# Patient Record
Sex: Male | Born: 1945
Health system: Southern US, Community
[De-identification: ages and names within clinical notes are randomized; demographics above are authoritative.]

## PROBLEM LIST (undated history)

## (undated) DIAGNOSIS — J301 Allergic rhinitis due to pollen: Secondary | ICD-10-CM

## (undated) DIAGNOSIS — N529 Male erectile dysfunction, unspecified: Secondary | ICD-10-CM

## (undated) DIAGNOSIS — I10 Essential (primary) hypertension: Secondary | ICD-10-CM

## (undated) DIAGNOSIS — G4730 Sleep apnea, unspecified: Secondary | ICD-10-CM

## (undated) DIAGNOSIS — Z6831 Body mass index (BMI) 31.0-31.9, adult: Secondary | ICD-10-CM

## (undated) DIAGNOSIS — E669 Obesity, unspecified: Secondary | ICD-10-CM

## (undated) DIAGNOSIS — E7800 Pure hypercholesterolemia, unspecified: Secondary | ICD-10-CM

## (undated) DIAGNOSIS — R911 Solitary pulmonary nodule: Secondary | ICD-10-CM

## (undated) DIAGNOSIS — K219 Gastro-esophageal reflux disease without esophagitis: Secondary | ICD-10-CM

## (undated) DIAGNOSIS — I700 Atherosclerosis of aorta: Secondary | ICD-10-CM

## (undated) DIAGNOSIS — Q211 Atrial septal defect, unspecified: Secondary | ICD-10-CM

## (undated) DIAGNOSIS — K22710 Barrett's esophagus with low grade dysplasia: Secondary | ICD-10-CM

## (undated) HISTORY — DX: Pure hypercholesterolemia, unspecified: E78.00

## (undated) HISTORY — DX: Body mass index (BMI) 31.0-31.9, adult: Z68.31

## (undated) HISTORY — DX: Barrett's esophagus with low grade dysplasia: K22.710

## (undated) HISTORY — DX: Male erectile dysfunction, unspecified: N52.9

## (undated) HISTORY — DX: Allergic rhinitis due to pollen: J30.1

## (undated) HISTORY — DX: Atrial septal defect, unspecified: Q21.10

## (undated) HISTORY — DX: Obesity, unspecified: E66.9

## (undated) HISTORY — DX: Solitary pulmonary nodule: R91.1

## (undated) HISTORY — DX: Atherosclerosis of aorta: I70.0

---

## 1997-12-31 ENCOUNTER — Ambulatory Visit (HOSPITAL_COMMUNITY): Admission: RE | Admit: 1997-12-31 | Discharge: 1997-12-31 | Payer: Self-pay | Admitting: Gastroenterology

## 2001-01-17 ENCOUNTER — Ambulatory Visit (HOSPITAL_COMMUNITY): Admission: RE | Admit: 2001-01-17 | Discharge: 2001-01-17 | Payer: Self-pay | Admitting: Gastroenterology

## 2003-12-29 ENCOUNTER — Ambulatory Visit (HOSPITAL_COMMUNITY): Admission: RE | Admit: 2003-12-29 | Discharge: 2003-12-29 | Payer: Self-pay | Admitting: Gastroenterology

## 2012-12-18 DIAGNOSIS — Z23 Encounter for immunization: Secondary | ICD-10-CM | POA: Diagnosis not present

## 2012-12-18 DIAGNOSIS — Z1331 Encounter for screening for depression: Secondary | ICD-10-CM | POA: Diagnosis not present

## 2012-12-18 DIAGNOSIS — R011 Cardiac murmur, unspecified: Secondary | ICD-10-CM | POA: Diagnosis not present

## 2012-12-18 DIAGNOSIS — G4733 Obstructive sleep apnea (adult) (pediatric): Secondary | ICD-10-CM | POA: Diagnosis not present

## 2012-12-18 DIAGNOSIS — Z0000 Encounter for general adult medical examination without abnormal findings: Secondary | ICD-10-CM | POA: Diagnosis not present

## 2012-12-18 DIAGNOSIS — I10 Essential (primary) hypertension: Secondary | ICD-10-CM | POA: Diagnosis not present

## 2012-12-18 DIAGNOSIS — K219 Gastro-esophageal reflux disease without esophagitis: Secondary | ICD-10-CM | POA: Diagnosis not present

## 2012-12-18 DIAGNOSIS — R972 Elevated prostate specific antigen [PSA]: Secondary | ICD-10-CM | POA: Diagnosis not present

## 2012-12-18 DIAGNOSIS — E785 Hyperlipidemia, unspecified: Secondary | ICD-10-CM | POA: Diagnosis not present

## 2013-01-06 ENCOUNTER — Encounter: Payer: Self-pay | Admitting: Cardiovascular Disease

## 2013-01-06 ENCOUNTER — Ambulatory Visit (HOSPITAL_COMMUNITY): Payer: Medicare Other | Attending: Internal Medicine | Admitting: Cardiology

## 2013-01-06 ENCOUNTER — Other Ambulatory Visit (HOSPITAL_COMMUNITY): Payer: Self-pay | Admitting: Internal Medicine

## 2013-01-06 DIAGNOSIS — R011 Cardiac murmur, unspecified: Secondary | ICD-10-CM | POA: Insufficient documentation

## 2013-01-06 DIAGNOSIS — Q2111 Secundum atrial septal defect: Secondary | ICD-10-CM | POA: Insufficient documentation

## 2013-01-06 DIAGNOSIS — Q211 Atrial septal defect: Secondary | ICD-10-CM | POA: Insufficient documentation

## 2013-01-06 DIAGNOSIS — E785 Hyperlipidemia, unspecified: Secondary | ICD-10-CM | POA: Diagnosis not present

## 2013-01-06 DIAGNOSIS — I10 Essential (primary) hypertension: Secondary | ICD-10-CM | POA: Diagnosis not present

## 2013-01-06 DIAGNOSIS — I079 Rheumatic tricuspid valve disease, unspecified: Secondary | ICD-10-CM | POA: Insufficient documentation

## 2013-01-06 NOTE — Progress Notes (Signed)
Echo performed. 

## 2013-02-12 DIAGNOSIS — K219 Gastro-esophageal reflux disease without esophagitis: Secondary | ICD-10-CM | POA: Diagnosis not present

## 2013-02-12 DIAGNOSIS — E785 Hyperlipidemia, unspecified: Secondary | ICD-10-CM | POA: Diagnosis not present

## 2013-02-12 DIAGNOSIS — R011 Cardiac murmur, unspecified: Secondary | ICD-10-CM | POA: Diagnosis not present

## 2013-02-12 DIAGNOSIS — I10 Essential (primary) hypertension: Secondary | ICD-10-CM | POA: Diagnosis not present

## 2013-02-12 DIAGNOSIS — R972 Elevated prostate specific antigen [PSA]: Secondary | ICD-10-CM | POA: Diagnosis not present

## 2013-03-11 DIAGNOSIS — R972 Elevated prostate specific antigen [PSA]: Secondary | ICD-10-CM | POA: Diagnosis not present

## 2013-03-11 DIAGNOSIS — E785 Hyperlipidemia, unspecified: Secondary | ICD-10-CM | POA: Diagnosis not present

## 2013-03-11 DIAGNOSIS — Z79899 Other long term (current) drug therapy: Secondary | ICD-10-CM | POA: Diagnosis not present

## 2013-03-11 DIAGNOSIS — Q2111 Secundum atrial septal defect: Secondary | ICD-10-CM | POA: Diagnosis not present

## 2013-03-11 DIAGNOSIS — K219 Gastro-esophageal reflux disease without esophagitis: Secondary | ICD-10-CM | POA: Diagnosis not present

## 2013-03-11 DIAGNOSIS — I10 Essential (primary) hypertension: Secondary | ICD-10-CM | POA: Diagnosis not present

## 2013-03-11 DIAGNOSIS — Q211 Atrial septal defect: Secondary | ICD-10-CM | POA: Diagnosis not present

## 2013-06-18 DIAGNOSIS — E785 Hyperlipidemia, unspecified: Secondary | ICD-10-CM | POA: Diagnosis not present

## 2013-06-18 DIAGNOSIS — R972 Elevated prostate specific antigen [PSA]: Secondary | ICD-10-CM | POA: Diagnosis not present

## 2013-06-19 DIAGNOSIS — K219 Gastro-esophageal reflux disease without esophagitis: Secondary | ICD-10-CM | POA: Diagnosis not present

## 2013-06-19 DIAGNOSIS — G4733 Obstructive sleep apnea (adult) (pediatric): Secondary | ICD-10-CM | POA: Diagnosis not present

## 2013-06-19 DIAGNOSIS — E785 Hyperlipidemia, unspecified: Secondary | ICD-10-CM | POA: Diagnosis not present

## 2013-06-19 DIAGNOSIS — I10 Essential (primary) hypertension: Secondary | ICD-10-CM | POA: Diagnosis not present

## 2013-06-19 DIAGNOSIS — R972 Elevated prostate specific antigen [PSA]: Secondary | ICD-10-CM | POA: Diagnosis not present

## 2013-07-07 DIAGNOSIS — R972 Elevated prostate specific antigen [PSA]: Secondary | ICD-10-CM | POA: Diagnosis not present

## 2013-07-07 DIAGNOSIS — N401 Enlarged prostate with lower urinary tract symptoms: Secondary | ICD-10-CM | POA: Diagnosis not present

## 2013-07-07 DIAGNOSIS — N138 Other obstructive and reflux uropathy: Secondary | ICD-10-CM | POA: Diagnosis not present

## 2013-08-07 DIAGNOSIS — R972 Elevated prostate specific antigen [PSA]: Secondary | ICD-10-CM | POA: Diagnosis not present

## 2013-08-18 DIAGNOSIS — N401 Enlarged prostate with lower urinary tract symptoms: Secondary | ICD-10-CM | POA: Diagnosis not present

## 2013-08-18 DIAGNOSIS — R972 Elevated prostate specific antigen [PSA]: Secondary | ICD-10-CM | POA: Diagnosis not present

## 2013-08-18 DIAGNOSIS — N138 Other obstructive and reflux uropathy: Secondary | ICD-10-CM | POA: Diagnosis not present

## 2013-08-18 DIAGNOSIS — N139 Obstructive and reflux uropathy, unspecified: Secondary | ICD-10-CM | POA: Diagnosis not present

## 2013-12-29 DIAGNOSIS — Z23 Encounter for immunization: Secondary | ICD-10-CM | POA: Diagnosis not present

## 2013-12-29 DIAGNOSIS — G4733 Obstructive sleep apnea (adult) (pediatric): Secondary | ICD-10-CM | POA: Diagnosis not present

## 2013-12-29 DIAGNOSIS — I10 Essential (primary) hypertension: Secondary | ICD-10-CM | POA: Diagnosis not present

## 2013-12-29 DIAGNOSIS — E780 Pure hypercholesterolemia: Secondary | ICD-10-CM | POA: Diagnosis not present

## 2013-12-29 DIAGNOSIS — Z1389 Encounter for screening for other disorder: Secondary | ICD-10-CM | POA: Diagnosis not present

## 2013-12-29 DIAGNOSIS — K219 Gastro-esophageal reflux disease without esophagitis: Secondary | ICD-10-CM | POA: Diagnosis not present

## 2014-01-26 DIAGNOSIS — I10 Essential (primary) hypertension: Secondary | ICD-10-CM | POA: Diagnosis not present

## 2014-01-26 DIAGNOSIS — G4733 Obstructive sleep apnea (adult) (pediatric): Secondary | ICD-10-CM | POA: Diagnosis not present

## 2014-01-26 DIAGNOSIS — R079 Chest pain, unspecified: Secondary | ICD-10-CM | POA: Diagnosis not present

## 2014-02-17 ENCOUNTER — Ambulatory Visit
Admission: RE | Admit: 2014-02-17 | Discharge: 2014-02-17 | Disposition: A | Discharge status: Home / Self Care | Payer: Medicare Other | Source: Ambulatory Visit | Attending: Internal Medicine | Admitting: Internal Medicine

## 2014-02-17 ENCOUNTER — Other Ambulatory Visit: Payer: Self-pay | Admitting: Internal Medicine

## 2014-02-17 DIAGNOSIS — R0789 Other chest pain: Secondary | ICD-10-CM | POA: Diagnosis not present

## 2014-02-17 DIAGNOSIS — S299XXA Unspecified injury of thorax, initial encounter: Secondary | ICD-10-CM | POA: Diagnosis not present

## 2014-02-17 DIAGNOSIS — R079 Chest pain, unspecified: Secondary | ICD-10-CM | POA: Diagnosis not present

## 2014-02-18 ENCOUNTER — Ambulatory Visit
Admission: RE | Admit: 2014-02-18 | Discharge: 2014-02-18 | Disposition: A | Discharge status: Home / Self Care | Payer: Medicare Other | Source: Ambulatory Visit | Attending: Internal Medicine | Admitting: Internal Medicine

## 2014-02-18 ENCOUNTER — Other Ambulatory Visit: Payer: Self-pay | Admitting: Internal Medicine

## 2014-02-18 DIAGNOSIS — R9389 Abnormal findings on diagnostic imaging of other specified body structures: Secondary | ICD-10-CM

## 2014-02-18 DIAGNOSIS — R0789 Other chest pain: Secondary | ICD-10-CM

## 2014-02-18 DIAGNOSIS — R911 Solitary pulmonary nodule: Secondary | ICD-10-CM | POA: Diagnosis not present

## 2014-02-23 ENCOUNTER — Other Ambulatory Visit (HOSPITAL_COMMUNITY): Payer: Self-pay | Admitting: Internal Medicine

## 2014-02-23 ENCOUNTER — Other Ambulatory Visit: Payer: Self-pay | Admitting: Internal Medicine

## 2014-02-23 DIAGNOSIS — I10 Essential (primary) hypertension: Secondary | ICD-10-CM | POA: Diagnosis not present

## 2014-02-23 DIAGNOSIS — R911 Solitary pulmonary nodule: Secondary | ICD-10-CM | POA: Diagnosis not present

## 2014-02-23 DIAGNOSIS — S2239XA Fracture of one rib, unspecified side, initial encounter for closed fracture: Secondary | ICD-10-CM | POA: Diagnosis not present

## 2014-02-27 ENCOUNTER — Ambulatory Visit (HOSPITAL_COMMUNITY)
Admission: RE | Admit: 2014-02-27 | Discharge: 2014-02-27 | Disposition: A | Discharge status: Home / Self Care | Payer: Medicare Other | Source: Ambulatory Visit | Attending: Internal Medicine | Admitting: Internal Medicine

## 2014-02-27 DIAGNOSIS — I10 Essential (primary) hypertension: Secondary | ICD-10-CM | POA: Diagnosis not present

## 2014-02-27 DIAGNOSIS — R911 Solitary pulmonary nodule: Secondary | ICD-10-CM | POA: Diagnosis not present

## 2014-02-27 LAB — GLUCOSE, CAPILLARY: Glucose-Capillary: 84 mg/dL (ref 70–99)

## 2014-02-27 MED ORDER — FLUDEOXYGLUCOSE F - 18 (FDG) INJECTION
11.3000 | Freq: Once | INTRAVENOUS | Status: AC | PRN
  Administered 2014-02-27: 11.3 via INTRAVENOUS

## 2014-03-05 DIAGNOSIS — R972 Elevated prostate specific antigen [PSA]: Secondary | ICD-10-CM | POA: Diagnosis not present

## 2014-03-09 DIAGNOSIS — D490 Neoplasm of unspecified behavior of digestive system: Secondary | ICD-10-CM | POA: Diagnosis not present

## 2014-03-12 DIAGNOSIS — N401 Enlarged prostate with lower urinary tract symptoms: Secondary | ICD-10-CM | POA: Diagnosis not present

## 2014-03-12 DIAGNOSIS — R3912 Poor urinary stream: Secondary | ICD-10-CM | POA: Diagnosis not present

## 2014-03-12 DIAGNOSIS — N138 Other obstructive and reflux uropathy: Secondary | ICD-10-CM | POA: Diagnosis not present

## 2014-03-12 DIAGNOSIS — R972 Elevated prostate specific antigen [PSA]: Secondary | ICD-10-CM | POA: Diagnosis not present

## 2014-03-19 ENCOUNTER — Other Ambulatory Visit: Payer: Self-pay | Admitting: Gastroenterology

## 2014-05-04 DIAGNOSIS — D490 Neoplasm of unspecified behavior of digestive system: Secondary | ICD-10-CM | POA: Diagnosis not present

## 2014-07-08 ENCOUNTER — Other Ambulatory Visit: Payer: Self-pay | Admitting: Gastroenterology

## 2014-07-29 DIAGNOSIS — N5201 Erectile dysfunction due to arterial insufficiency: Secondary | ICD-10-CM | POA: Diagnosis not present

## 2014-07-29 DIAGNOSIS — I10 Essential (primary) hypertension: Secondary | ICD-10-CM | POA: Diagnosis not present

## 2014-07-29 DIAGNOSIS — R911 Solitary pulmonary nodule: Secondary | ICD-10-CM | POA: Diagnosis not present

## 2014-08-11 ENCOUNTER — Encounter (HOSPITAL_COMMUNITY): Payer: Self-pay | Admitting: *Deleted

## 2014-08-17 ENCOUNTER — Encounter (HOSPITAL_COMMUNITY)
Admission: RE | Disposition: A | Discharge status: Home / Self Care | Payer: Self-pay | Source: Ambulatory Visit | Attending: Gastroenterology

## 2014-08-17 ENCOUNTER — Encounter (HOSPITAL_COMMUNITY): Payer: Self-pay | Admitting: *Deleted

## 2014-08-17 ENCOUNTER — Ambulatory Visit (HOSPITAL_COMMUNITY)
Admission: RE | Admit: 2014-08-17 | Discharge: 2014-08-17 | Disposition: A | Discharge status: Home / Self Care | Payer: Medicare Other | Source: Ambulatory Visit | Attending: Gastroenterology | Admitting: Gastroenterology

## 2014-08-17 ENCOUNTER — Ambulatory Visit (HOSPITAL_COMMUNITY): Payer: Medicare Other | Admitting: Registered Nurse

## 2014-08-17 DIAGNOSIS — K22710 Barrett's esophagus with low grade dysplasia: Secondary | ICD-10-CM | POA: Diagnosis not present

## 2014-08-17 DIAGNOSIS — Z1211 Encounter for screening for malignant neoplasm of colon: Secondary | ICD-10-CM | POA: Diagnosis not present

## 2014-08-17 DIAGNOSIS — Z79899 Other long term (current) drug therapy: Secondary | ICD-10-CM | POA: Diagnosis not present

## 2014-08-17 DIAGNOSIS — G4730 Sleep apnea, unspecified: Secondary | ICD-10-CM | POA: Diagnosis not present

## 2014-08-17 DIAGNOSIS — K5790 Diverticulosis of intestine, part unspecified, without perforation or abscess without bleeding: Secondary | ICD-10-CM | POA: Diagnosis not present

## 2014-08-17 DIAGNOSIS — I517 Cardiomegaly: Secondary | ICD-10-CM | POA: Diagnosis not present

## 2014-08-17 DIAGNOSIS — I10 Essential (primary) hypertension: Secondary | ICD-10-CM | POA: Insufficient documentation

## 2014-08-17 DIAGNOSIS — K219 Gastro-esophageal reflux disease without esophagitis: Secondary | ICD-10-CM | POA: Insufficient documentation

## 2014-08-17 DIAGNOSIS — G4733 Obstructive sleep apnea (adult) (pediatric): Secondary | ICD-10-CM | POA: Insufficient documentation

## 2014-08-17 DIAGNOSIS — K5730 Diverticulosis of large intestine without perforation or abscess without bleeding: Secondary | ICD-10-CM | POA: Diagnosis not present

## 2014-08-17 HISTORY — DX: Sleep apnea, unspecified: G47.30

## 2014-08-17 HISTORY — DX: Essential (primary) hypertension: I10

## 2014-08-17 HISTORY — PX: COLONOSCOPY WITH PROPOFOL: SHX5780

## 2014-08-17 HISTORY — DX: Gastro-esophageal reflux disease without esophagitis: K21.9

## 2014-08-17 SURGERY — COLONOSCOPY WITH PROPOFOL
Anesthesia: Monitor Anesthesia Care

## 2014-08-17 MED ORDER — PROPOFOL INFUSION 10 MG/ML OPTIME
INTRAVENOUS | Status: DC | PRN
  Administered 2014-08-17: 120 ug/kg/min via INTRAVENOUS

## 2014-08-17 MED ORDER — LIDOCAINE HCL (CARDIAC) 20 MG/ML IV SOLN
INTRAVENOUS | Status: DC | PRN
  Administered 2014-08-17: 100 mg via INTRAVENOUS

## 2014-08-17 MED ORDER — PROPOFOL 10 MG/ML IV BOLUS
INTRAVENOUS | Status: AC
  Filled 2014-08-17: qty 20

## 2014-08-17 MED ORDER — PROPOFOL 10 MG/ML IV BOLUS
INTRAVENOUS | Status: DC | PRN
  Administered 2014-08-17: 30 mg via INTRAVENOUS

## 2014-08-17 MED ORDER — LACTATED RINGERS IV SOLN
INTRAVENOUS | Status: DC
  Administered 2014-08-17: 1000 mL via INTRAVENOUS

## 2014-08-17 MED ORDER — SODIUM CHLORIDE 0.9 % IV SOLN: INTRAVENOUS | Status: DC

## 2014-08-17 MED ORDER — LIDOCAINE HCL (CARDIAC) 20 MG/ML IV SOLN
INTRAVENOUS | Status: AC
  Filled 2014-08-17: qty 5

## 2014-08-17 SURGICAL SUPPLY — 22 items

## 2014-08-17 NOTE — Op Note (Signed)
Procedure: Screening colonoscopy. Normal screening colonoscopy performed on 12/29/2003  Endoscopist: Earle Gell  Premedication: Propofol administered by anesthesia  Procedure: The patient was placed in the left lateral decubitus position. Anal inspection and digital rectal exam were normal. The Pentax pediatric colonoscope was introduced into the rectum and advanced to the cecum. A normal-appearing appendiceal orifice and ileocecal valve were identified. Colonic preparation for the exam today was good. Withdrawal time was 12 minutes  Rectum. Normal. Retroflexed view of the distal rectum was normal  Sigmoid colon and descending colon. Left colonic diverticulosis  Splenic flexure. Normal  Transverse colon. Normal  Hepatic flexure. Normal  Ascending colon series normal  Cecum and ileocecal valve. Normal  Assessment: Normal screening colonoscopy  Recommendation: Schedule repeat screening colonoscopy in 10 years

## 2014-08-17 NOTE — H&P (Signed)
  Procedure: Screening colonoscopy. Normal screening colonoscopy performed on 12/29/2003  History: The patient is a 69 year old male born [birth year]. He is scheduled to undergo a repeat screening colonoscopy today.  Past medical history: Lipoma removed from his neck surgically in 1982. Hypertension. Left ventricular hypertrophy by cardiac echo. Chronic gastroesophageal reflux complicated by Barrett's esophagus with low-grade dysplasia. Obstructive sleep apnea syndrome.  Medication allergies: Codeine causes odynophagia. Lisinopril causes cough. Losartan cause fatigue.  Exam: The patient is alert and lying comfortably on the endoscopy stretcher. Abdomen is soft and nontender to palpation. Lungs are clear to auscultation. Cardiac exam reveals a regular rhythm.  Plan: Proceed with screening colonoscopy

## 2014-08-17 NOTE — Anesthesia Postprocedure Evaluation (Signed)
  Anesthesia Post-op Note  Patient: Alexander Cooper  Procedure(s) Performed: Procedure(s) (LRB): COLONOSCOPY WITH PROPOFOL (N/A)  Patient Location: PACU  Anesthesia Type: MAC  Level of Consciousness: awake and alert   Airway and Oxygen Therapy: Patient Spontanous Breathing  Post-op Pain: mild  Post-op Assessment: Post-op Vital signs reviewed, Patient's Cardiovascular Status Stable, Respiratory Function Stable, Patent Airway and No signs of Nausea or vomiting  Last Vitals:  Filed Vitals:   08/17/14 0857  BP: 136/70  Pulse:   Temp:   Resp: 15    Post-op Vital Signs: stable   Complications: No apparent anesthesia complications

## 2014-08-17 NOTE — Discharge Instructions (Signed)
Colonoscopy, Care After °These instructions give you information on caring for yourself after your procedure. Your doctor may also give you more specific instructions. Call your doctor if you have any problems or questions after your procedure. °HOME CARE °· Do not drive for 24 hours. °· Do not sign important papers or use machinery for 24 hours. °· You may shower. °· You may go back to your usual activities, but go slower for the first 24 hours. °· Take rest breaks often during the first 24 hours. °· Walk around or use warm packs on your belly (abdomen) if you have belly cramping or gas. °· Drink enough fluids to keep your pee (urine) clear or pale yellow. °· Resume your normal diet. Avoid heavy or fried foods. °· Avoid drinking alcohol for 24 hours or as told by your doctor. °· Only take medicines as told by your doctor. °If a tissue sample (biopsy) was taken during the procedure:  °· Do not take aspirin or blood thinners for 7 days, or as told by your doctor. °· Do not drink alcohol for 7 days, or as told by your doctor. °· Eat soft foods for the first 24 hours. °GET HELP IF: °You still have a small amount of blood in your poop (stool) 2-3 days after the procedure. °GET HELP RIGHT AWAY IF: °· You have more than a small amount of blood in your poop. °· You see clumps of tissue (blood clots) in your poop. °· Your belly is puffy (swollen). °· You feel sick to your stomach (nauseous) or throw up (vomit). °· You have a fever. °· You have belly pain that gets worse and medicine does not help. °MAKE SURE YOU: °· Understand these instructions. °· Will watch your condition. °· Will get help right away if you are not doing well or get worse. °Document Released: 01/21/2010 Document Revised: 12/24/2012 Document Reviewed: 08/26/2012 °[employer]® Patient Information ©2015 [employer]. This information is not intended to replace advice given to you by your health care provider. Make sure you discuss any questions you have with  your health care provider. ° °

## 2014-08-17 NOTE — Anesthesia Preprocedure Evaluation (Addendum)
Anesthesia Evaluation  Patient identified by MRN, date of birth, ID band Patient awake    Reviewed: Allergy & Precautions, NPO status , Patient's Chart, lab work & pertinent test results  Airway Mallampati: II  TM Distance: >3 FB Neck ROM: Full    Dental no notable dental hx.    Pulmonary sleep apnea and Continuous Positive Airway Pressure Ventilation ,  breath sounds clear to auscultation  Pulmonary exam normal       Cardiovascular hypertension, Pt. on medications negative cardio ROS Normal cardiovascular examRhythm:Regular Rate:Normal     Neuro/Psych negative neurological ROS  negative psych ROS   GI/Hepatic Neg liver ROS, GERD-  ,  Endo/Other  negative endocrine ROS  Renal/GU negative Renal ROS  negative genitourinary   Musculoskeletal negative musculoskeletal ROS (+)   Abdominal   Peds negative pediatric ROS (+)  Hematology negative hematology ROS (+)   Anesthesia Other Findings   Reproductive/Obstetrics negative OB ROS                            Anesthesia Physical Anesthesia Plan  ASA: II  Anesthesia Plan: MAC   Post-op Pain Management:    Induction:   Airway Management Planned: Simple Face Mask  Additional Equipment:   Intra-op Plan:   Post-operative Plan:   Informed Consent: I have reviewed the patients History and Physical, chart, labs and discussed the procedure including the risks, benefits and alternatives for the proposed anesthesia with the patient or authorized representative who has indicated his/her understanding and acceptance.   Dental advisory given  Plan Discussed with: CRNA  Anesthesia Plan Comments:         Anesthesia Quick Evaluation

## 2014-08-17 NOTE — Transfer of Care (Signed)
Immediate Anesthesia Transfer of Care Note  Patient: Alexander Cooper  Procedure(s) Performed: Procedure(s): COLONOSCOPY WITH PROPOFOL (N/A)  Patient Location: PACU and Endoscopy Unit  Anesthesia Type:MAC  Level of Consciousness: awake, alert , oriented and patient cooperative  Airway & Oxygen Therapy: Patient Spontanous Breathing and Patient connected to face mask oxygen  Post-op Assessment: Report given to RN, Post -op Vital signs reviewed and stable and Patient moving all extremities  Post vital signs: Reviewed and stable  Last Vitals:  Filed Vitals:   08/17/14 0705  BP: 144/73  Temp:   Resp:     Complications: No apparent anesthesia complications

## 2014-08-18 ENCOUNTER — Encounter (HOSPITAL_COMMUNITY): Payer: Self-pay | Admitting: Gastroenterology

## 2014-09-15 DIAGNOSIS — R972 Elevated prostate specific antigen [PSA]: Secondary | ICD-10-CM | POA: Diagnosis not present

## 2014-12-24 ENCOUNTER — Other Ambulatory Visit: Payer: Self-pay | Admitting: Internal Medicine

## 2014-12-24 DIAGNOSIS — Z1389 Encounter for screening for other disorder: Secondary | ICD-10-CM | POA: Diagnosis not present

## 2014-12-24 DIAGNOSIS — I10 Essential (primary) hypertension: Secondary | ICD-10-CM | POA: Diagnosis not present

## 2014-12-24 DIAGNOSIS — K219 Gastro-esophageal reflux disease without esophagitis: Secondary | ICD-10-CM | POA: Diagnosis not present

## 2014-12-24 DIAGNOSIS — N5201 Erectile dysfunction due to arterial insufficiency: Secondary | ICD-10-CM | POA: Diagnosis not present

## 2014-12-24 DIAGNOSIS — Z0000 Encounter for general adult medical examination without abnormal findings: Secondary | ICD-10-CM | POA: Diagnosis not present

## 2014-12-24 DIAGNOSIS — K22710 Barrett's esophagus with low grade dysplasia: Secondary | ICD-10-CM | POA: Diagnosis not present

## 2014-12-24 DIAGNOSIS — E7800 Pure hypercholesterolemia, unspecified: Secondary | ICD-10-CM | POA: Diagnosis not present

## 2014-12-24 DIAGNOSIS — Z23 Encounter for immunization: Secondary | ICD-10-CM | POA: Diagnosis not present

## 2014-12-24 DIAGNOSIS — R911 Solitary pulmonary nodule: Secondary | ICD-10-CM

## 2014-12-24 DIAGNOSIS — G4733 Obstructive sleep apnea (adult) (pediatric): Secondary | ICD-10-CM | POA: Diagnosis not present

## 2015-02-04 DIAGNOSIS — I10 Essential (primary) hypertension: Secondary | ICD-10-CM | POA: Diagnosis not present

## 2015-02-04 DIAGNOSIS — Z79899 Other long term (current) drug therapy: Secondary | ICD-10-CM | POA: Diagnosis not present

## 2015-02-04 DIAGNOSIS — K219 Gastro-esophageal reflux disease without esophagitis: Secondary | ICD-10-CM | POA: Diagnosis not present

## 2015-02-04 DIAGNOSIS — Z885 Allergy status to narcotic agent status: Secondary | ICD-10-CM | POA: Diagnosis not present

## 2015-02-04 DIAGNOSIS — Z7982 Long term (current) use of aspirin: Secondary | ICD-10-CM | POA: Diagnosis not present

## 2015-02-04 DIAGNOSIS — K22710 Barrett's esophagus with low grade dysplasia: Secondary | ICD-10-CM | POA: Diagnosis not present

## 2015-02-05 ENCOUNTER — Ambulatory Visit
Admission: RE | Admit: 2015-02-05 | Discharge: 2015-02-05 | Disposition: A | Discharge status: Home / Self Care | Payer: Medicare Other | Source: Ambulatory Visit | Attending: Internal Medicine | Admitting: Internal Medicine

## 2015-02-05 DIAGNOSIS — R911 Solitary pulmonary nodule: Secondary | ICD-10-CM | POA: Diagnosis not present

## 2015-03-15 DIAGNOSIS — Z885 Allergy status to narcotic agent status: Secondary | ICD-10-CM | POA: Diagnosis not present

## 2015-03-15 DIAGNOSIS — I10 Essential (primary) hypertension: Secondary | ICD-10-CM | POA: Diagnosis not present

## 2015-03-15 DIAGNOSIS — Z791 Long term (current) use of non-steroidal anti-inflammatories (NSAID): Secondary | ICD-10-CM | POA: Diagnosis not present

## 2015-03-15 DIAGNOSIS — Z91018 Allergy to other foods: Secondary | ICD-10-CM | POA: Diagnosis not present

## 2015-03-15 DIAGNOSIS — Z79899 Other long term (current) drug therapy: Secondary | ICD-10-CM | POA: Diagnosis not present

## 2015-03-15 DIAGNOSIS — K219 Gastro-esophageal reflux disease without esophagitis: Secondary | ICD-10-CM | POA: Diagnosis not present

## 2015-03-15 DIAGNOSIS — Z7982 Long term (current) use of aspirin: Secondary | ICD-10-CM | POA: Diagnosis not present

## 2015-03-15 DIAGNOSIS — K449 Diaphragmatic hernia without obstruction or gangrene: Secondary | ICD-10-CM | POA: Diagnosis not present

## 2015-03-15 DIAGNOSIS — K22710 Barrett's esophagus with low grade dysplasia: Secondary | ICD-10-CM | POA: Diagnosis not present

## 2015-03-15 DIAGNOSIS — K2270 Barrett's esophagus without dysplasia: Secondary | ICD-10-CM | POA: Diagnosis not present

## 2015-06-04 DIAGNOSIS — N138 Other obstructive and reflux uropathy: Secondary | ICD-10-CM | POA: Diagnosis not present

## 2015-06-04 DIAGNOSIS — R3912 Poor urinary stream: Secondary | ICD-10-CM | POA: Diagnosis not present

## 2015-06-04 DIAGNOSIS — N401 Enlarged prostate with lower urinary tract symptoms: Secondary | ICD-10-CM | POA: Diagnosis not present

## 2015-06-04 DIAGNOSIS — R972 Elevated prostate specific antigen [PSA]: Secondary | ICD-10-CM | POA: Diagnosis not present

## 2015-07-09 DIAGNOSIS — E7800 Pure hypercholesterolemia, unspecified: Secondary | ICD-10-CM | POA: Diagnosis not present

## 2015-07-09 DIAGNOSIS — E669 Obesity, unspecified: Secondary | ICD-10-CM | POA: Diagnosis not present

## 2015-07-09 DIAGNOSIS — I10 Essential (primary) hypertension: Secondary | ICD-10-CM | POA: Diagnosis not present

## 2015-07-09 DIAGNOSIS — Z6830 Body mass index (BMI) 30.0-30.9, adult: Secondary | ICD-10-CM | POA: Diagnosis not present

## 2015-07-09 DIAGNOSIS — K22710 Barrett's esophagus with low grade dysplasia: Secondary | ICD-10-CM | POA: Diagnosis not present

## 2015-07-09 DIAGNOSIS — R972 Elevated prostate specific antigen [PSA]: Secondary | ICD-10-CM | POA: Diagnosis not present

## 2015-07-09 DIAGNOSIS — K219 Gastro-esophageal reflux disease without esophagitis: Secondary | ICD-10-CM | POA: Diagnosis not present

## 2015-11-18 DIAGNOSIS — Z23 Encounter for immunization: Secondary | ICD-10-CM | POA: Diagnosis not present

## 2016-01-12 DIAGNOSIS — I10 Essential (primary) hypertension: Secondary | ICD-10-CM | POA: Diagnosis not present

## 2016-01-12 DIAGNOSIS — E7800 Pure hypercholesterolemia, unspecified: Secondary | ICD-10-CM | POA: Diagnosis not present

## 2016-01-18 DIAGNOSIS — K219 Gastro-esophageal reflux disease without esophagitis: Secondary | ICD-10-CM | POA: Diagnosis not present

## 2016-01-18 DIAGNOSIS — Q211 Atrial septal defect: Secondary | ICD-10-CM | POA: Diagnosis not present

## 2016-01-18 DIAGNOSIS — R911 Solitary pulmonary nodule: Secondary | ICD-10-CM | POA: Diagnosis not present

## 2016-01-18 DIAGNOSIS — Z1389 Encounter for screening for other disorder: Secondary | ICD-10-CM | POA: Diagnosis not present

## 2016-01-18 DIAGNOSIS — I10 Essential (primary) hypertension: Secondary | ICD-10-CM | POA: Diagnosis not present

## 2016-01-18 DIAGNOSIS — K22710 Barrett's esophagus with low grade dysplasia: Secondary | ICD-10-CM | POA: Diagnosis not present

## 2016-01-18 DIAGNOSIS — G4733 Obstructive sleep apnea (adult) (pediatric): Secondary | ICD-10-CM | POA: Diagnosis not present

## 2016-01-18 DIAGNOSIS — Z0000 Encounter for general adult medical examination without abnormal findings: Secondary | ICD-10-CM | POA: Diagnosis not present

## 2016-01-18 DIAGNOSIS — E7800 Pure hypercholesterolemia, unspecified: Secondary | ICD-10-CM | POA: Diagnosis not present

## 2016-03-20 DIAGNOSIS — Z79899 Other long term (current) drug therapy: Secondary | ICD-10-CM | POA: Diagnosis not present

## 2016-03-20 DIAGNOSIS — Z791 Long term (current) use of non-steroidal anti-inflammatories (NSAID): Secondary | ICD-10-CM | POA: Diagnosis not present

## 2016-03-20 DIAGNOSIS — K449 Diaphragmatic hernia without obstruction or gangrene: Secondary | ICD-10-CM | POA: Diagnosis not present

## 2016-03-20 DIAGNOSIS — Z885 Allergy status to narcotic agent status: Secondary | ICD-10-CM | POA: Diagnosis not present

## 2016-03-20 DIAGNOSIS — Z7982 Long term (current) use of aspirin: Secondary | ICD-10-CM | POA: Diagnosis not present

## 2016-03-20 DIAGNOSIS — Z91018 Allergy to other foods: Secondary | ICD-10-CM | POA: Diagnosis not present

## 2016-03-20 DIAGNOSIS — K219 Gastro-esophageal reflux disease without esophagitis: Secondary | ICD-10-CM | POA: Diagnosis not present

## 2016-03-20 DIAGNOSIS — I10 Essential (primary) hypertension: Secondary | ICD-10-CM | POA: Diagnosis not present

## 2016-03-20 DIAGNOSIS — K22710 Barrett's esophagus with low grade dysplasia: Secondary | ICD-10-CM | POA: Diagnosis not present

## 2016-03-20 DIAGNOSIS — K2270 Barrett's esophagus without dysplasia: Secondary | ICD-10-CM | POA: Diagnosis not present

## 2016-07-18 ENCOUNTER — Other Ambulatory Visit: Payer: Self-pay | Admitting: Internal Medicine

## 2016-07-18 DIAGNOSIS — K219 Gastro-esophageal reflux disease without esophagitis: Secondary | ICD-10-CM | POA: Diagnosis not present

## 2016-07-18 DIAGNOSIS — E7800 Pure hypercholesterolemia, unspecified: Secondary | ICD-10-CM | POA: Diagnosis not present

## 2016-07-18 DIAGNOSIS — R911 Solitary pulmonary nodule: Secondary | ICD-10-CM

## 2016-07-18 DIAGNOSIS — G4733 Obstructive sleep apnea (adult) (pediatric): Secondary | ICD-10-CM | POA: Diagnosis not present

## 2016-07-18 DIAGNOSIS — I10 Essential (primary) hypertension: Secondary | ICD-10-CM | POA: Diagnosis not present

## 2016-07-18 DIAGNOSIS — E876 Hypokalemia: Secondary | ICD-10-CM | POA: Diagnosis not present

## 2016-07-18 DIAGNOSIS — N5201 Erectile dysfunction due to arterial insufficiency: Secondary | ICD-10-CM | POA: Diagnosis not present

## 2016-07-25 ENCOUNTER — Ambulatory Visit
Admission: RE | Admit: 2016-07-25 | Discharge: 2016-07-25 | Disposition: A | Discharge status: Home / Self Care | Payer: Medicare Other | Source: Ambulatory Visit | Attending: Internal Medicine | Admitting: Internal Medicine

## 2016-07-25 DIAGNOSIS — R911 Solitary pulmonary nodule: Secondary | ICD-10-CM

## 2016-07-25 DIAGNOSIS — R0602 Shortness of breath: Secondary | ICD-10-CM | POA: Diagnosis not present

## 2016-07-25 DIAGNOSIS — R918 Other nonspecific abnormal finding of lung field: Secondary | ICD-10-CM | POA: Diagnosis not present

## 2016-09-11 DIAGNOSIS — G4733 Obstructive sleep apnea (adult) (pediatric): Secondary | ICD-10-CM | POA: Diagnosis not present

## 2016-09-12 DIAGNOSIS — G4733 Obstructive sleep apnea (adult) (pediatric): Secondary | ICD-10-CM | POA: Diagnosis not present

## 2016-09-27 DIAGNOSIS — R351 Nocturia: Secondary | ICD-10-CM | POA: Diagnosis not present

## 2016-09-27 DIAGNOSIS — R3912 Poor urinary stream: Secondary | ICD-10-CM | POA: Diagnosis not present

## 2016-09-27 DIAGNOSIS — N401 Enlarged prostate with lower urinary tract symptoms: Secondary | ICD-10-CM | POA: Diagnosis not present

## 2016-09-27 DIAGNOSIS — R972 Elevated prostate specific antigen [PSA]: Secondary | ICD-10-CM | POA: Diagnosis not present

## 2016-11-13 DIAGNOSIS — R252 Cramp and spasm: Secondary | ICD-10-CM | POA: Diagnosis not present

## 2016-11-13 DIAGNOSIS — J301 Allergic rhinitis due to pollen: Secondary | ICD-10-CM | POA: Diagnosis not present

## 2016-11-13 DIAGNOSIS — Z23 Encounter for immunization: Secondary | ICD-10-CM | POA: Diagnosis not present

## 2017-03-07 DIAGNOSIS — N5201 Erectile dysfunction due to arterial insufficiency: Secondary | ICD-10-CM | POA: Diagnosis not present

## 2017-03-07 DIAGNOSIS — E669 Obesity, unspecified: Secondary | ICD-10-CM | POA: Diagnosis not present

## 2017-03-07 DIAGNOSIS — I10 Essential (primary) hypertension: Secondary | ICD-10-CM | POA: Diagnosis not present

## 2017-03-07 DIAGNOSIS — Z6831 Body mass index (BMI) 31.0-31.9, adult: Secondary | ICD-10-CM | POA: Diagnosis not present

## 2017-03-07 DIAGNOSIS — Z0000 Encounter for general adult medical examination without abnormal findings: Secondary | ICD-10-CM | POA: Diagnosis not present

## 2017-03-07 DIAGNOSIS — Z1389 Encounter for screening for other disorder: Secondary | ICD-10-CM | POA: Diagnosis not present

## 2017-03-07 DIAGNOSIS — G4733 Obstructive sleep apnea (adult) (pediatric): Secondary | ICD-10-CM | POA: Diagnosis not present

## 2017-03-07 DIAGNOSIS — Z1159 Encounter for screening for other viral diseases: Secondary | ICD-10-CM | POA: Diagnosis not present

## 2017-03-07 DIAGNOSIS — R21 Rash and other nonspecific skin eruption: Secondary | ICD-10-CM | POA: Diagnosis not present

## 2017-03-07 DIAGNOSIS — R748 Abnormal levels of other serum enzymes: Secondary | ICD-10-CM | POA: Diagnosis not present

## 2017-03-07 DIAGNOSIS — K219 Gastro-esophageal reflux disease without esophagitis: Secondary | ICD-10-CM | POA: Diagnosis not present

## 2017-03-13 DIAGNOSIS — K22710 Barrett's esophagus with low grade dysplasia: Secondary | ICD-10-CM | POA: Diagnosis not present

## 2017-03-13 DIAGNOSIS — K208 Other esophagitis: Secondary | ICD-10-CM | POA: Diagnosis not present

## 2017-03-13 DIAGNOSIS — K449 Diaphragmatic hernia without obstruction or gangrene: Secondary | ICD-10-CM | POA: Diagnosis not present

## 2017-03-13 DIAGNOSIS — K2270 Barrett's esophagus without dysplasia: Secondary | ICD-10-CM | POA: Diagnosis not present

## 2017-09-17 DIAGNOSIS — K219 Gastro-esophageal reflux disease without esophagitis: Secondary | ICD-10-CM | POA: Diagnosis not present

## 2017-09-17 DIAGNOSIS — G4733 Obstructive sleep apnea (adult) (pediatric): Secondary | ICD-10-CM | POA: Diagnosis not present

## 2017-09-17 DIAGNOSIS — Z23 Encounter for immunization: Secondary | ICD-10-CM | POA: Diagnosis not present

## 2017-09-17 DIAGNOSIS — I10 Essential (primary) hypertension: Secondary | ICD-10-CM | POA: Diagnosis not present

## 2017-11-06 DIAGNOSIS — N401 Enlarged prostate with lower urinary tract symptoms: Secondary | ICD-10-CM | POA: Diagnosis not present

## 2017-11-06 DIAGNOSIS — R3915 Urgency of urination: Secondary | ICD-10-CM | POA: Diagnosis not present

## 2017-11-06 DIAGNOSIS — R972 Elevated prostate specific antigen [PSA]: Secondary | ICD-10-CM | POA: Diagnosis not present

## 2018-03-04 DIAGNOSIS — K2270 Barrett's esophagus without dysplasia: Secondary | ICD-10-CM | POA: Diagnosis not present

## 2018-03-04 DIAGNOSIS — K22710 Barrett's esophagus with low grade dysplasia: Secondary | ICD-10-CM | POA: Diagnosis not present

## 2018-03-04 DIAGNOSIS — K449 Diaphragmatic hernia without obstruction or gangrene: Secondary | ICD-10-CM | POA: Diagnosis not present

## 2018-04-08 DIAGNOSIS — K22710 Barrett's esophagus with low grade dysplasia: Secondary | ICD-10-CM | POA: Diagnosis not present

## 2018-04-08 DIAGNOSIS — Z1389 Encounter for screening for other disorder: Secondary | ICD-10-CM | POA: Diagnosis not present

## 2018-04-08 DIAGNOSIS — I10 Essential (primary) hypertension: Secondary | ICD-10-CM | POA: Diagnosis not present

## 2018-04-08 DIAGNOSIS — Z0000 Encounter for general adult medical examination without abnormal findings: Secondary | ICD-10-CM | POA: Diagnosis not present

## 2018-04-08 DIAGNOSIS — E7800 Pure hypercholesterolemia, unspecified: Secondary | ICD-10-CM | POA: Diagnosis not present

## 2018-04-08 DIAGNOSIS — K219 Gastro-esophageal reflux disease without esophagitis: Secondary | ICD-10-CM | POA: Diagnosis not present

## 2018-04-08 DIAGNOSIS — G4733 Obstructive sleep apnea (adult) (pediatric): Secondary | ICD-10-CM | POA: Diagnosis not present

## 2018-04-10 DIAGNOSIS — I10 Essential (primary) hypertension: Secondary | ICD-10-CM | POA: Diagnosis not present

## 2018-04-10 DIAGNOSIS — E7800 Pure hypercholesterolemia, unspecified: Secondary | ICD-10-CM | POA: Diagnosis not present

## 2018-10-02 DIAGNOSIS — I10 Essential (primary) hypertension: Secondary | ICD-10-CM | POA: Diagnosis not present

## 2018-10-02 DIAGNOSIS — M549 Dorsalgia, unspecified: Secondary | ICD-10-CM | POA: Diagnosis not present

## 2018-10-02 DIAGNOSIS — K219 Gastro-esophageal reflux disease without esophagitis: Secondary | ICD-10-CM | POA: Diagnosis not present

## 2018-10-02 DIAGNOSIS — E7800 Pure hypercholesterolemia, unspecified: Secondary | ICD-10-CM | POA: Diagnosis not present

## 2018-11-15 DIAGNOSIS — N401 Enlarged prostate with lower urinary tract symptoms: Secondary | ICD-10-CM | POA: Diagnosis not present

## 2018-11-22 DIAGNOSIS — N401 Enlarged prostate with lower urinary tract symptoms: Secondary | ICD-10-CM | POA: Diagnosis not present

## 2018-11-22 DIAGNOSIS — R3915 Urgency of urination: Secondary | ICD-10-CM | POA: Diagnosis not present

## 2019-01-14 DIAGNOSIS — Z20828 Contact with and (suspected) exposure to other viral communicable diseases: Secondary | ICD-10-CM | POA: Diagnosis not present

## 2019-03-10 DIAGNOSIS — Z0000 Encounter for general adult medical examination without abnormal findings: Secondary | ICD-10-CM | POA: Diagnosis not present

## 2019-03-10 DIAGNOSIS — K219 Gastro-esophageal reflux disease without esophagitis: Secondary | ICD-10-CM | POA: Diagnosis not present

## 2019-03-10 DIAGNOSIS — E7800 Pure hypercholesterolemia, unspecified: Secondary | ICD-10-CM | POA: Diagnosis not present

## 2019-03-10 DIAGNOSIS — K22710 Barrett's esophagus with low grade dysplasia: Secondary | ICD-10-CM | POA: Diagnosis not present

## 2019-03-10 DIAGNOSIS — E538 Deficiency of other specified B group vitamins: Secondary | ICD-10-CM | POA: Diagnosis not present

## 2019-03-10 DIAGNOSIS — I10 Essential (primary) hypertension: Secondary | ICD-10-CM | POA: Diagnosis not present

## 2019-03-10 DIAGNOSIS — Z5181 Encounter for therapeutic drug level monitoring: Secondary | ICD-10-CM | POA: Diagnosis not present

## 2019-03-10 DIAGNOSIS — Z1389 Encounter for screening for other disorder: Secondary | ICD-10-CM | POA: Diagnosis not present

## 2019-03-11 DIAGNOSIS — Z01812 Encounter for preprocedural laboratory examination: Secondary | ICD-10-CM | POA: Diagnosis not present

## 2019-03-11 DIAGNOSIS — K22710 Barrett's esophagus with low grade dysplasia: Secondary | ICD-10-CM | POA: Diagnosis not present

## 2019-03-11 DIAGNOSIS — Z20822 Contact with and (suspected) exposure to covid-19: Secondary | ICD-10-CM | POA: Diagnosis not present

## 2019-03-17 DIAGNOSIS — K3189 Other diseases of stomach and duodenum: Secondary | ICD-10-CM | POA: Diagnosis not present

## 2019-03-17 DIAGNOSIS — K22710 Barrett's esophagus with low grade dysplasia: Secondary | ICD-10-CM | POA: Diagnosis not present

## 2019-03-17 DIAGNOSIS — Z79899 Other long term (current) drug therapy: Secondary | ICD-10-CM | POA: Diagnosis not present

## 2019-03-17 DIAGNOSIS — K2270 Barrett's esophagus without dysplasia: Secondary | ICD-10-CM | POA: Diagnosis not present

## 2019-03-17 DIAGNOSIS — K449 Diaphragmatic hernia without obstruction or gangrene: Secondary | ICD-10-CM | POA: Diagnosis not present

## 2019-08-11 DIAGNOSIS — K219 Gastro-esophageal reflux disease without esophagitis: Secondary | ICD-10-CM | POA: Diagnosis not present

## 2019-08-11 DIAGNOSIS — E7800 Pure hypercholesterolemia, unspecified: Secondary | ICD-10-CM | POA: Diagnosis not present

## 2019-08-11 DIAGNOSIS — I10 Essential (primary) hypertension: Secondary | ICD-10-CM | POA: Diagnosis not present

## 2019-08-12 DIAGNOSIS — K2270 Barrett's esophagus without dysplasia: Secondary | ICD-10-CM | POA: Diagnosis not present

## 2019-08-12 DIAGNOSIS — K22710 Barrett's esophagus with low grade dysplasia: Secondary | ICD-10-CM | POA: Diagnosis not present

## 2019-08-12 DIAGNOSIS — K22719 Barrett's esophagus with dysplasia, unspecified: Secondary | ICD-10-CM | POA: Diagnosis not present

## 2019-08-18 DIAGNOSIS — E7800 Pure hypercholesterolemia, unspecified: Secondary | ICD-10-CM | POA: Diagnosis not present

## 2019-08-18 DIAGNOSIS — I10 Essential (primary) hypertension: Secondary | ICD-10-CM | POA: Diagnosis not present

## 2019-12-29 DIAGNOSIS — N401 Enlarged prostate with lower urinary tract symptoms: Secondary | ICD-10-CM | POA: Diagnosis not present

## 2020-03-11 DIAGNOSIS — Z1389 Encounter for screening for other disorder: Secondary | ICD-10-CM | POA: Diagnosis not present

## 2020-03-11 DIAGNOSIS — E7800 Pure hypercholesterolemia, unspecified: Secondary | ICD-10-CM | POA: Diagnosis not present

## 2020-03-11 DIAGNOSIS — K219 Gastro-esophageal reflux disease without esophagitis: Secondary | ICD-10-CM | POA: Diagnosis not present

## 2020-03-11 DIAGNOSIS — G4733 Obstructive sleep apnea (adult) (pediatric): Secondary | ICD-10-CM | POA: Diagnosis not present

## 2020-03-11 DIAGNOSIS — I10 Essential (primary) hypertension: Secondary | ICD-10-CM | POA: Diagnosis not present

## 2020-03-11 DIAGNOSIS — Z0000 Encounter for general adult medical examination without abnormal findings: Secondary | ICD-10-CM | POA: Diagnosis not present

## 2020-03-11 DIAGNOSIS — K22710 Barrett's esophagus with low grade dysplasia: Secondary | ICD-10-CM | POA: Diagnosis not present

## 2020-03-15 ENCOUNTER — Other Ambulatory Visit: Payer: Self-pay | Admitting: Internal Medicine

## 2020-03-15 DIAGNOSIS — E7800 Pure hypercholesterolemia, unspecified: Secondary | ICD-10-CM

## 2020-03-22 DIAGNOSIS — K449 Diaphragmatic hernia without obstruction or gangrene: Secondary | ICD-10-CM | POA: Diagnosis not present

## 2020-03-22 DIAGNOSIS — K2270 Barrett's esophagus without dysplasia: Secondary | ICD-10-CM | POA: Diagnosis not present

## 2020-03-22 DIAGNOSIS — K22710 Barrett's esophagus with low grade dysplasia: Secondary | ICD-10-CM | POA: Diagnosis not present

## 2020-03-30 DIAGNOSIS — K219 Gastro-esophageal reflux disease without esophagitis: Secondary | ICD-10-CM | POA: Diagnosis not present

## 2020-03-30 DIAGNOSIS — E7800 Pure hypercholesterolemia, unspecified: Secondary | ICD-10-CM | POA: Diagnosis not present

## 2020-03-30 DIAGNOSIS — I10 Essential (primary) hypertension: Secondary | ICD-10-CM | POA: Diagnosis not present

## 2020-04-01 ENCOUNTER — Emergency Department (HOSPITAL_BASED_OUTPATIENT_CLINIC_OR_DEPARTMENT_OTHER): Payer: Medicare Other

## 2020-04-01 ENCOUNTER — Emergency Department (HOSPITAL_COMMUNITY): Payer: Medicare Other

## 2020-04-01 ENCOUNTER — Encounter (HOSPITAL_COMMUNITY): Payer: Self-pay | Admitting: Internal Medicine

## 2020-04-01 ENCOUNTER — Inpatient Hospital Stay (HOSPITAL_COMMUNITY)
Admission: EM | Admit: 2020-04-01 | Discharge: 2020-04-03 | DRG: 572 | Disposition: A | Discharge status: Home / Self Care | Payer: Medicare Other | Attending: Internal Medicine | Admitting: Internal Medicine

## 2020-04-01 ENCOUNTER — Other Ambulatory Visit: Payer: Self-pay

## 2020-04-01 DIAGNOSIS — R21 Rash and other nonspecific skin eruption: Secondary | ICD-10-CM | POA: Diagnosis present

## 2020-04-01 DIAGNOSIS — L97511 Non-pressure chronic ulcer of other part of right foot limited to breakdown of skin: Secondary | ICD-10-CM

## 2020-04-01 DIAGNOSIS — G4733 Obstructive sleep apnea (adult) (pediatric): Secondary | ICD-10-CM | POA: Diagnosis present

## 2020-04-01 DIAGNOSIS — Z23 Encounter for immunization: Secondary | ICD-10-CM

## 2020-04-01 DIAGNOSIS — B958 Unspecified staphylococcus as the cause of diseases classified elsewhere: Secondary | ICD-10-CM | POA: Diagnosis present

## 2020-04-01 DIAGNOSIS — R600 Localized edema: Secondary | ICD-10-CM | POA: Diagnosis not present

## 2020-04-01 DIAGNOSIS — I10 Essential (primary) hypertension: Secondary | ICD-10-CM | POA: Diagnosis not present

## 2020-04-01 DIAGNOSIS — Z8249 Family history of ischemic heart disease and other diseases of the circulatory system: Secondary | ICD-10-CM

## 2020-04-01 DIAGNOSIS — Z79899 Other long term (current) drug therapy: Secondary | ICD-10-CM

## 2020-04-01 DIAGNOSIS — L97519 Non-pressure chronic ulcer of other part of right foot with unspecified severity: Secondary | ICD-10-CM | POA: Diagnosis not present

## 2020-04-01 DIAGNOSIS — L0390 Cellulitis, unspecified: Secondary | ICD-10-CM | POA: Diagnosis present

## 2020-04-01 DIAGNOSIS — K219 Gastro-esophageal reflux disease without esophagitis: Secondary | ICD-10-CM | POA: Diagnosis present

## 2020-04-01 DIAGNOSIS — M79604 Pain in right leg: Secondary | ICD-10-CM | POA: Diagnosis not present

## 2020-04-01 DIAGNOSIS — Z7982 Long term (current) use of aspirin: Secondary | ICD-10-CM

## 2020-04-01 DIAGNOSIS — R609 Edema, unspecified: Secondary | ICD-10-CM | POA: Diagnosis not present

## 2020-04-01 DIAGNOSIS — E876 Hypokalemia: Secondary | ICD-10-CM | POA: Diagnosis present

## 2020-04-01 DIAGNOSIS — Z20822 Contact with and (suspected) exposure to covid-19: Secondary | ICD-10-CM | POA: Diagnosis not present

## 2020-04-01 DIAGNOSIS — Z885 Allergy status to narcotic agent status: Secondary | ICD-10-CM

## 2020-04-01 DIAGNOSIS — I350 Nonrheumatic aortic (valve) stenosis: Secondary | ICD-10-CM

## 2020-04-01 DIAGNOSIS — M25469 Effusion, unspecified knee: Secondary | ICD-10-CM

## 2020-04-01 DIAGNOSIS — L03115 Cellulitis of right lower limb: Principal | ICD-10-CM | POA: Diagnosis present

## 2020-04-01 DIAGNOSIS — M7989 Other specified soft tissue disorders: Secondary | ICD-10-CM | POA: Diagnosis not present

## 2020-04-01 DIAGNOSIS — L97509 Non-pressure chronic ulcer of other part of unspecified foot with unspecified severity: Secondary | ICD-10-CM | POA: Diagnosis present

## 2020-04-01 LAB — CBC
HCT: 41.1 % (ref 39.0–52.0)
HCT: 42.2 % (ref 39.0–52.0)
Hemoglobin: 13.5 g/dL (ref 13.0–17.0)
Hemoglobin: 14 g/dL (ref 13.0–17.0)
MCH: 29.9 pg (ref 26.0–34.0)
MCH: 30.3 pg (ref 26.0–34.0)
MCHC: 32.8 g/dL (ref 30.0–36.0)
MCHC: 33.2 g/dL (ref 30.0–36.0)
MCV: 90.9 fL (ref 80.0–100.0)
MCV: 91.3 fL (ref 80.0–100.0)
Platelets: 235 10*3/uL (ref 150–400)
Platelets: 223 10*3/uL (ref 150–400)
RBC: 4.52 MIL/uL (ref 4.22–5.81)
RBC: 4.62 MIL/uL (ref 4.22–5.81)
RDW: 13.4 % (ref 11.5–15.5)
RDW: 13.5 % (ref 11.5–15.5)
WBC: 7.5 10*3/uL (ref 4.0–10.5)
WBC: 7.7 10*3/uL (ref 4.0–10.5)
nRBC: 0 % (ref 0.0–0.2)
nRBC: 0 % (ref 0.0–0.2)

## 2020-04-01 LAB — DIFFERENTIAL
Abs Immature Granulocytes: 0.02 10*3/uL (ref 0.00–0.07)
Basophils Absolute: 0 10*3/uL (ref 0.0–0.1)
Basophils Relative: 1 %
Eosinophils Absolute: 0.2 10*3/uL (ref 0.0–0.5)
Eosinophils Relative: 3 %
Immature Granulocytes: 0 %
Lymphocytes Relative: 16 %
Lymphs Abs: 1.2 10*3/uL (ref 0.7–4.0)
Monocytes Absolute: 0.7 10*3/uL (ref 0.1–1.0)
Monocytes Relative: 9 %
Neutro Abs: 5.4 10*3/uL (ref 1.7–7.7)
Neutrophils Relative %: 71 %

## 2020-04-01 LAB — BASIC METABOLIC PANEL
Anion gap: 7 (ref 5–15)
BUN: 13 mg/dL (ref 8–23)
CO2: 27 mmol/L (ref 22–32)
Calcium: 8.9 mg/dL (ref 8.9–10.3)
Chloride: 104 mmol/L (ref 98–111)
Creatinine, Ser: 0.93 mg/dL (ref 0.61–1.24)
GFR, Estimated: >60 mL/min (ref >60)
Glucose, Bld: 84 mg/dL (ref 70–99)
Potassium: 3.7 mmol/L (ref 3.5–5.1)
Sodium: 138 mmol/L (ref 135–145)

## 2020-04-01 LAB — HEMOGLOBIN A1C
Hgb A1c MFr Bld: 5.2 % (ref 4.8–5.6)
Mean Plasma Glucose: 102.54 mg/dL

## 2020-04-01 LAB — LACTIC ACID, PLASMA
Lactic Acid, Venous: 0.9 mmol/L (ref 0.5–1.9)
Lactic Acid, Venous: 1.1 mmol/L (ref 0.5–1.9)

## 2020-04-01 MED ORDER — VANCOMYCIN HCL 2000 MG/400ML IV SOLN
2000.0000 mg | Freq: Once | INTRAVENOUS | Status: AC
  Administered 2020-04-01: 2000 mg via INTRAVENOUS
  Filled 2020-04-01: qty 400

## 2020-04-01 MED ORDER — SODIUM CHLORIDE 0.9 % IV SOLN
75.0000 mL/h | INTRAVENOUS | Status: AC
  Administered 2020-04-01: 75 mL/h via INTRAVENOUS

## 2020-04-01 MED ORDER — TETANUS-DIPHTH-ACELL PERTUSSIS 5-2.5-18.5 LF-MCG/0.5 IM SUSY
0.5000 mL | PREFILLED_SYRINGE | Freq: Once | INTRAMUSCULAR | Status: AC
  Administered 2020-04-01: 0.5 mL via INTRAMUSCULAR
  Filled 2020-04-01: qty 0.5

## 2020-04-01 MED ORDER — PANTOPRAZOLE SODIUM 40 MG PO TBEC
40.0000 mg | DELAYED_RELEASE_TABLET | Freq: Every day | ORAL | Status: DC
  Administered 2020-04-01 – 2020-04-03 (×3): 40 mg via ORAL
  Filled 2020-04-01 (×3): qty 1

## 2020-04-01 MED ORDER — SODIUM CHLORIDE 0.9 % IV SOLN
2.0000 g | INTRAVENOUS | Status: DC
  Administered 2020-04-01 – 2020-04-02 (×2): 2 g via INTRAVENOUS
  Filled 2020-04-01 (×2): qty 20

## 2020-04-01 MED ORDER — DOCUSATE SODIUM 100 MG PO CAPS
100.0000 mg | ORAL_CAPSULE | Freq: Two times a day (BID) | ORAL | Status: DC
  Administered 2020-04-01 – 2020-04-03 (×4): 100 mg via ORAL
  Filled 2020-04-01 (×4): qty 1

## 2020-04-01 MED ORDER — IRBESARTAN 300 MG PO TABS
300.0000 mg | ORAL_TABLET | Freq: Every day | ORAL | Status: DC
  Administered 2020-04-01 – 2020-04-03 (×3): 300 mg via ORAL
  Filled 2020-04-01 (×3): qty 1

## 2020-04-01 MED ORDER — HYDROCODONE-ACETAMINOPHEN 5-325 MG PO TABS
1.0000 | ORAL_TABLET | ORAL | Status: DC | PRN
  Administered 2020-04-03: 1 via ORAL
  Filled 2020-04-01: qty 1

## 2020-04-01 MED ORDER — PIPERACILLIN-TAZOBACTAM 3.375 G IVPB 30 MIN
3.3750 g | Freq: Once | INTRAVENOUS | Status: AC
  Administered 2020-04-01: 3.375 g via INTRAVENOUS
  Filled 2020-04-01: qty 50

## 2020-04-01 MED ORDER — ACETAMINOPHEN 650 MG RE SUPP: 650.0000 mg | Freq: Four times a day (QID) | RECTAL | Status: DC | PRN

## 2020-04-01 MED ORDER — SENNA 8.6 MG PO TABS
1.0000 | ORAL_TABLET | Freq: Two times a day (BID) | ORAL | Status: DC
  Administered 2020-04-01 – 2020-04-03 (×4): 8.6 mg via ORAL
  Filled 2020-04-01 (×4): qty 1

## 2020-04-01 MED ORDER — MORPHINE SULFATE (PF) 2 MG/ML IV SOLN: 2.0000 mg | INTRAVENOUS | Status: DC | PRN

## 2020-04-01 MED ORDER — ACETAMINOPHEN 325 MG PO TABS: 650.0000 mg | ORAL_TABLET | Freq: Four times a day (QID) | ORAL | Status: DC | PRN

## 2020-04-01 MED ORDER — VANCOMYCIN HCL 1250 MG/250ML IV SOLN
1250.0000 mg | Freq: Two times a day (BID) | INTRAVENOUS | Status: DC
  Administered 2020-04-02 – 2020-04-03 (×3): 1250 mg via INTRAVENOUS
  Filled 2020-04-01 (×4): qty 250

## 2020-04-01 MED ORDER — AMLODIPINE BESYLATE 5 MG PO TABS
2.5000 mg | ORAL_TABLET | Freq: Every day | ORAL | Status: DC
  Administered 2020-04-01 – 2020-04-03 (×3): 2.5 mg via ORAL
  Filled 2020-04-01 (×3): qty 1

## 2020-04-01 NOTE — ED Provider Notes (Signed)
[facility] EMERGENCY DEPARTMENT Provider Note   CSN: [id number] Arrival date & time: 04/01/20  1512     History Chief Complaint  Patient presents with  . Leg Swelling    Alexander Cooper is a 75 y.o. male.  HPI      75yo hypertension, OSA presents with concern for presents with concertn for increased pain and swelling to RLE.  Reports that he does not work, but that he "gets paid to exercise" and is on his feet a lot.  Reports noticing an ulcer to the bottom of his toe, but has continued to work and noticed that it was not healing.  3 days ago, developed pain and erythema to his right ankle which over the last 3 days has spread past his knee.  He saw his primary care doctor who referred him to the emergency department for ultrasound to evaluate for DVT.  He denies fevers, chills, nausea, vomiting, chest pain, shortness of breath.  He has no history of diabetes or neuropathy.    Past Medical History:  Diagnosis Date  . GERD (gastroesophageal reflux disease)   . Hypertension   . Sleep apnea    Cpap does not aways use    Patient Active Problem List   Diagnosis Date Noted  . Cellulitis 04/01/2020  . Essential hypertension 04/01/2020  . OSA (obstructive sleep apnea) 04/01/2020  . GERD (gastroesophageal reflux disease) 04/01/2020  . Foot ulcer ([place]) 04/01/2020    Past Surgical History:  Procedure Laterality Date  . COLONOSCOPY WITH PROPOFOL N/A 08/17/2014   Procedure: COLONOSCOPY WITH PROPOFOL;  Surgeon: Garlan Fair, MD;  Location: [facility];  Service: Endoscopy;  Laterality: N/A;       Family History  Problem Relation Age of Onset  . CAD Father     Social History   Tobacco Use  . Smoking status: Never Smoker  . Smokeless tobacco: Never Used  Substance Use Topics  . Alcohol use: Yes    Comment: occasionally  . Drug use: No    Home Medications Prior to Admission medications   Medication Sig Start Date End Date Taking? Authorizing  Provider  amLODipine (NORVASC) 2.5 MG tablet Take 2.5 mg by mouth daily.   Yes [provider]  chlorthalidone (HYGROTON) 25 MG tablet Take 25 mg by mouth daily.   Yes [provider]  GRAPE SEED CR PO Take 2 tablets by mouth daily.   Yes [provider]  omeprazole (PRILOSEC) 40 MG capsule Take 40 mg by mouth 2 (two) times daily.   Yes [provider]  potassium chloride SA (KLOR-CON) 20 MEQ tablet Take 20 mEq by mouth 2 (two) times daily.   Yes [provider]  psyllium (HYDROCIL/METAMUCIL) 95 % PACK Take 1 packet by mouth daily.   Yes [provider]  telmisartan (MICARDIS) 40 MG tablet Take 40 mg by mouth in the morning and at bedtime.   Yes [provider]  traMADol (ULTRAM) 50 MG tablet Take 50 mg by mouth every 12 (twelve) hours as needed for moderate pain.   Yes [provider]  triamcinolone (KENALOG) 0.1 % Apply 1 application topically 2 (two) times daily as needed (for rash).   Yes [provider]  vitamin B-12 (CYANOCOBALAMIN) 1000 MCG tablet Take 1,000 mcg by mouth daily.   Yes [provider]    Allergies    Codeine and Chocolate flavor  Review of Systems   Review of Systems  Constitutional: Negative for fever.  HENT: Negative for sore throat.   Eyes: Negative for visual disturbance.  Respiratory: Negative for shortness of breath.   Cardiovascular: Negative for chest pain.  Gastrointestinal: Negative for abdominal pain, nausea and vomiting.  Genitourinary: Negative for difficulty urinating.  Musculoskeletal: Positive for arthralgias and myalgias.  Skin: Positive for rash and wound.  Neurological: Negative for syncope and headaches.    Physical Exam Updated Vital Signs BP (!) 163/90 (BP Location: Right Arm)   Pulse 70   Temp 98.2 F (36.8 C) (Oral)   Resp 20   SpO2 99%   Physical Exam Vitals and nursing note reviewed.  Constitutional:      General: He is not in acute  distress.    Appearance: He is well-developed. He is not diaphoretic.  HENT:     Head: Normocephalic and atraumatic.  Eyes:     Conjunctiva/sclera: Conjunctivae normal.  Cardiovascular:     Rate and Rhythm: Normal rate and regular rhythm.     Heart sounds: Normal heart sounds. No murmur heard. No friction rub. No gallop.   Pulmonary:     Effort: Pulmonary effort is normal. No respiratory distress.     Breath sounds: Normal breath sounds. No wheezing or rales.  Abdominal:     General: There is no distension.     Palpations: Abdomen is soft.     Tenderness: There is no abdominal tenderness. There is no guarding.  Musculoskeletal:        General: Swelling and tenderness present.     Cervical back: Normal range of motion.     Comments: Good ROM of knee, ankle Normal pulses   Skin:    General: Skin is warm and dry.  Neurological:     Mental Status: He is alert and oriented to person, place, and time.     ED Results / Procedures / Treatments   Labs (all labs ordered are listed, but only abnormal results are displayed) Labs Reviewed  CBC WITH DIFFERENTIAL/PLATELET - Abnormal; Notable for the following components:      Result Value   RBC 4.11 (*)    Hemoglobin 12.5 (*)    HCT 36.8 (*)    All other components within normal limits  SARS CORONAVIRUS 2 (TAT 6-24 HRS)  CULTURE, BLOOD (ROUTINE X 2)  CULTURE, BLOOD (ROUTINE X 2)  MRSA PCR SCREENING  BASIC METABOLIC PANEL  CBC  LACTIC ACID, PLASMA  LACTIC ACID, PLASMA  DIFFERENTIAL  CBC  HEMOGLOBIN A1C  SEDIMENTATION RATE  C-REACTIVE PROTEIN  PREALBUMIN  HIV ANTIBODY (ROUTINE TESTING W REFLEX)  MAGNESIUM  PHOSPHORUS  TSH  COMPREHENSIVE METABOLIC PANEL  HEPATIC FUNCTION PANEL    EKG None  Radiology DG Foot Complete Right  Result Date: 04/01/2020 CLINICAL DATA:  Ulcer with increased pain and swelling. EXAM: RIGHT FOOT COMPLETE - 3+ VIEW COMPARISON:  None. FINDINGS: Soft tissue ulcer subjacent to the first digit  interphalangeal joint. No radiopaque foreign body. No evidence of associated osteomyelitis. No erosion or bony destruction. No fracture. Mild osteoarthritis in the midfoot and first metatarsal phalangeal joint. Moderate plantar calcaneal spur. No Achilles tendon enthesophyte. Dorsal soft tissue edema overlies the foot. IMPRESSION: 1. Soft tissue ulcer subjacent to the first digit interphalangeal joint. No evidence of osteomyelitis. No radiopaque foreign body. 2. Dorsal soft tissue edema. Electronically Signed   By: Keith Rake M.D.   On: 04/01/2020 18:23   VAS [place] LOWER EXTREMITY VENOUS (DVT) (ONLY [facility] & [facility] 7a-7p)  Result Date: 04/01/2020  Lower Venous DVT Study  Indications: Edema.  Comparison Study: no prior Performing Technologist: Abram Sander RVS  Examination Guidelines: A complete evaluation includes B-mode imaging, spectral Doppler, color Doppler, and power Doppler as needed of all accessible portions of each vessel. Bilateral testing is considered an integral part of a complete examination. Limited examinations for reoccurring indications may be performed as noted. The reflux portion of the exam is performed with the patient in reverse Trendelenburg.  +---------+---------------+---------+-----------+----------+-------------------+ RIGHT    CompressibilityPhasicitySpontaneityPropertiesThrombus Aging      +---------+---------------+---------+-----------+----------+-------------------+ CFV      Full           Yes      Yes                                      +---------+---------------+---------+-----------+----------+-------------------+ SFJ      Full                                                             +---------+---------------+---------+-----------+----------+-------------------+ FV Prox  Full                                                             +---------+---------------+---------+-----------+----------+-------------------+ FV Mid   Full                                                              +---------+---------------+---------+-----------+----------+-------------------+ FV DistalFull                                                             +---------+---------------+---------+-----------+----------+-------------------+ PFV      Full                                                             +---------+---------------+---------+-----------+----------+-------------------+ POP      Full           Yes      Yes                                      +---------+---------------+---------+-----------+----------+-------------------+ PTV      Full                                                             +---------+---------------+---------+-----------+----------+-------------------+  PERO                                                  Not well visualized +---------+---------------+---------+-----------+----------+-------------------+   +---------+---------------+---------+-----------+----------+-------------------+ LEFT     CompressibilityPhasicitySpontaneityPropertiesThrombus Aging      +---------+---------------+---------+-----------+----------+-------------------+ CFV      Full           Yes      Yes                                      +---------+---------------+---------+-----------+----------+-------------------+ SFJ      Full                                                             +---------+---------------+---------+-----------+----------+-------------------+ FV Prox  Full                                                             +---------+---------------+---------+-----------+----------+-------------------+ FV Mid   Full                                                             +---------+---------------+---------+-----------+----------+-------------------+ FV DistalFull                                                              +---------+---------------+---------+-----------+----------+-------------------+ PFV      Full                                                             +---------+---------------+---------+-----------+----------+-------------------+ POP      Full           Yes      Yes                                      +---------+---------------+---------+-----------+----------+-------------------+ PTV      Full                                                             +---------+---------------+---------+-----------+----------+-------------------+ PERO  Not well visualized +---------+---------------+---------+-----------+----------+-------------------+     Summary: BILATERAL: - No evidence of deep vein thrombosis seen in the lower extremities, bilaterally. - No evidence of superficial venous thrombosis in the lower extremities, bilaterally. -No evidence of popliteal cyst, bilaterally.   *See table(s) above for measurements and observations.    Preliminary     Procedures Procedures   Medications Ordered in ED Medications  vancomycin (VANCOREADY) IVPB 1250 mg/250 mL (has no administration in time range)  amLODipine (NORVASC) tablet 2.5 mg (2.5 mg Oral Given 04/01/20 2002)  pantoprazole (PROTONIX) EC tablet 40 mg (40 mg Oral Given 04/01/20 2002)  irbesartan (AVAPRO) tablet 300 mg (300 mg Oral Given 04/01/20 2002)  0.9 %  sodium chloride infusion (75 mL/hr Intravenous New Bag/Given 04/01/20 2003)  acetaminophen (TYLENOL) tablet 650 mg (has no administration in time range)    Or  acetaminophen (TYLENOL) suppository 650 mg (has no administration in time range)  HYDROcodone-acetaminophen (NORCO/VICODIN) 5-325 MG per tablet 1-2 tablet (has no administration in time range)  morphine 2 MG/ML injection 2 mg (has no administration in time range)  docusate sodium (COLACE) capsule 100 mg (100 mg Oral Given 04/01/20 2306)  senna (SENOKOT) tablet 8.6  mg (8.6 mg Oral Given 04/01/20 2306)  cefTRIAXone (ROCEPHIN) 2 g in sodium chloride 0.9 % 100 mL IVPB (2 g Intravenous New Bag/Given 04/01/20 2313)  piperacillin-tazobactam (ZOSYN) IVPB 3.375 g (0 g Intravenous Stopped 04/01/20 1855)  vancomycin (VANCOREADY) IVPB 2000 mg/400 mL (0 mg Intravenous Stopped 04/01/20 2113)  Tdap (BOOSTRIX) injection 0.5 mL (0.5 mLs Intramuscular Given 04/01/20 1900)    ED Course  I have reviewed the triage vital signs and the nursing notes.  Pertinent labs & imaging results that were available during my care of the patient were reviewed by me and considered in my medical decision making (see chart for details).    MDM Rules/Calculators/A&P                          Very pleasant 75yo male with history of hypertension, OSA presents with concern for presents with concertn for increased pain and swelling to RLE.    Has strong pulses bilaterally, no signs of acute arterial thrombus.  DVT study was completed which showed no evidence of DVT.  Exam is significant for cellulitis extending from the foot to the groin.  He has no signs of sepsis, however given amount of cellulitis and rapid spread over the last 2 days, will admit for IV antibiotics and monitoring.  X-ray of the foot shows no clear signs of osteomyelitis or foreign body. Admitted for further care.    Final Clinical Impression(s) / ED Diagnoses Final diagnoses:  Cellulitis of right lower extremity    Rx / DC Orders ED Discharge Orders    None       Gareth Morgan, MD 04/02/20 [phone]

## 2020-04-01 NOTE — Progress Notes (Signed)
Pharmacy Antibiotic Note  Alexander Cooper is a 75 y.o. male admitted on 04/01/2020 with cellulitis.  Pharmacy has been consulted for vancomycin dosing.  Plan: Vancomycin 2000 mg IV x 1, then 1250 mg IV every 12 hours (eAUC 526, Goal AUC 400-550, SCr 0.93) Monitor renal function, Cx and clinical progression to narrow Vancomycin levels at steady state     Temp (24hrs), Avg:98.9 F (37.2 C), Min:98.9 F (37.2 C), Max:98.9 F (37.2 C)  Recent Labs  Lab 04/01/20 1527  WBC 7.5  CREATININE 0.93    CrCl cannot be calculated (Unknown ideal weight.).    Allergies  Allergen Reactions  . Codeine Nausea And Vomiting    Bertis Ruddy, PharmD Clinical Pharmacist ED Pharmacist Phone # [phone] 04/01/2020 6:08 PM

## 2020-04-01 NOTE — H&P (Addendum)
Alexander Cooper FTD:[id number] DOB: March 18, 1945 DOA: 04/01/2020    PCP: Lavone Orn, MD   Outpatient Specialists:   NONE    Patient arrived to ER on 04/01/20 at 1512 Referred by Attending Gareth Morgan, MD   Patient coming from: home Lives alone,      Chief Complaint:   Chief Complaint  Patient presents with  . Leg Swelling    HPI: Alexander Cooper is a 75 y.o. male with medical history significant of OSA, HTN, GERD    Presented with   Right leg swelling with increased pain. He at first noted a scratch on his right foot 5-6 days ago and was trying to clean the wound with peroxide. 2 days later his ankle started to swell and he had to miss work next day his pain started under his right knee and he was very worried got an Appointment to see his PCP tomorrow but pain progressed and he decided to come in today. No fever no chills, He is very active does not smoke, drinks occasionally yesterday he noted that his Left foot started to swell too, he has gained at least 18 lb in the past few weeks.  Works at [employer] just to stay active.    Infectious risk factors:    Has been vaccinated against COVID    Initial COVID TEST   in house  PCR testing  Pending  No results found for: SARSCOV2NAA   Regarding pertinent Chronic problems:     HTN on NOrvasc, MIcardis    While in ER: Doppler neg for DVT, plain imaging no osteo    ED Triage Vitals  Enc Vitals Group     BP 04/01/20 1516 (!) 158/83     Pulse Rate 04/01/20 1516 78     Resp 04/01/20 1516 18     Temp 04/01/20 1516 98.9 F (37.2 C)     Temp Source 04/01/20 1516 Oral     SpO2 04/01/20 1516 99 %     Weight --      Height --      Head Circumference --      Peak Flow --      Pain Score 04/01/20 1525 3     Pain Loc --      Pain Edu? --      Excl. in [place]? --   TMAX(24)@     _________________________________________ Significant initial  Findings: Abnormal Labs Reviewed - No abnormal labs to  display ____________________________________________ Ordered  Foot film - no Osteo No radiopaque foreign body.   Lactic Acid, Venous    Component Value Date/Time   LATICACIDVEN 0.9 04/01/2020 1835   _______________________________________________ Hospitalist was called for admission for cellulitis  The following Work up has been ordered so far:  Orders Placed This Encounter  Procedures  . Blood culture (routine x 2)  . SARS CORONAVIRUS 2 (TAT 6-24 HRS) Nasopharyngeal Nasopharyngeal Swab  . DG Foot Complete Right  . Basic metabolic panel  . CBC  . Lactic acid, plasma  . Differential  . Document Height and Actual Weight  . vancomycin per pharmacy consult  . Consult to hospitalist  . Airborne and Contact precautions  . VAS [place] LOWER EXTREMITY VENOUS (DVT) (ONLY [facility] & [facility] 7a-7p)     Following Medications were ordered in ER: Medications  piperacillin-tazobactam (ZOSYN) IVPB 3.375 g (3.375 g Intravenous New Bag/Given 04/01/20 1831)  vancomycin (VANCOREADY) IVPB 2000 mg/400 mL (has no administration in time range)  vancomycin (VANCOREADY) IVPB  1250 mg/250 mL (has no administration in time range)  Tdap (BOOSTRIX) injection 0.5 mL (has no administration in time range)        Consult Orders  (From admission, onward)         Start     Ordered   04/01/20 1801  Consult to hospitalist  Once       Provider:  (Not yet assigned)  Question Answer Comment  Place call to: [facility]   Reason for Consult Admit      04/01/20 1800          OTHER Significant initial  Findings:  labs showing:    Recent Labs  Lab 04/01/20 1527  NA 138  K 3.7  CO2 27  GLUCOSE 84  BUN 13  CREATININE 0.93  CALCIUM 8.9    Cr   stable,   Lab Results  Component Value Date   CREATININE 0.93 04/01/2020    No results for input(s): AST, ALT, ALKPHOS, BILITOT, PROT, ALBUMIN in the last 168 hours. Lab Results  Component Value Date   CALCIUM 8.9 04/01/2020       Plt: Lab Results   Component Value Date   PLT 235 04/01/2020       Recent Labs  Lab 04/01/20 1527  WBC 7.5  HGB 13.5  HCT 41.1  MCV 90.9  PLT 235    HG/HCT  Stable,     Component Value Date/Time   HGB 13.5 04/01/2020 1527   HCT 41.1 04/01/2020 1527   MCV 90.9 04/01/2020 1527     DM  labs:  HbA1C: Recent Labs    04/01/20 1851  HGBA1C 5.2       CBG (last 3)  No results for input(s): GLUCAP in the last 72 hours.        Cultures: No results found for: SDES, [place], CULT, REPTSTATUS   Radiological Exams on Admission: DG Foot Complete Right  Result Date: 04/01/2020 CLINICAL DATA:  Ulcer with increased pain and swelling. EXAM: RIGHT FOOT COMPLETE - 3+ VIEW COMPARISON:  None. FINDINGS: Soft tissue ulcer subjacent to the first digit interphalangeal joint. No radiopaque foreign body. No evidence of associated osteomyelitis. No erosion or bony destruction. No fracture. Mild osteoarthritis in the midfoot and first metatarsal phalangeal joint. Moderate plantar calcaneal spur. No Achilles tendon enthesophyte. Dorsal soft tissue edema overlies the foot. IMPRESSION: 1. Soft tissue ulcer subjacent to the first digit interphalangeal joint. No evidence of osteomyelitis. No radiopaque foreign body. 2. Dorsal soft tissue edema. Electronically Signed   By: Keith Rake M.D.   On: 04/01/2020 18:23   VAS [place] LOWER EXTREMITY VENOUS (DVT) (ONLY [facility] & [facility] 7a-7p)  Result Date: 04/01/2020  Lower Venous DVT Study Indications: Edema.  Comparison Study: no prior Performing Technologist: Abram Sander RVS  Examination Guidelines: A complete evaluation includes B-mode imaging, spectral Doppler, color Doppler, and power Doppler as needed of all accessible portions of each vessel. Bilateral testing is considered an integral part of a complete examination. Limited examinations for reoccurring indications may be performed as noted. The reflux portion of the exam is performed with the patient in reverse Trendelenburg.   +---------+---------------+---------+-----------+----------+-------------------+ RIGHT    CompressibilityPhasicitySpontaneityPropertiesThrombus Aging      +---------+---------------+---------+-----------+----------+-------------------+ CFV      Full           Yes      Yes                                      +---------+---------------+---------+-----------+----------+-------------------+  SFJ      Full                                                             +---------+---------------+---------+-----------+----------+-------------------+ FV Prox  Full                                                             +---------+---------------+---------+-----------+----------+-------------------+ FV Mid   Full                                                             +---------+---------------+---------+-----------+----------+-------------------+ FV DistalFull                                                             +---------+---------------+---------+-----------+----------+-------------------+ PFV      Full                                                             +---------+---------------+---------+-----------+----------+-------------------+ POP      Full           Yes      Yes                                      +---------+---------------+---------+-----------+----------+-------------------+ PTV      Full                                                             +---------+---------------+---------+-----------+----------+-------------------+ PERO                                                  Not well visualized +---------+---------------+---------+-----------+----------+-------------------+   +---------+---------------+---------+-----------+----------+-------------------+ LEFT     CompressibilityPhasicitySpontaneityPropertiesThrombus Aging      +---------+---------------+---------+-----------+----------+-------------------+  CFV      Full           Yes      Yes                                      +---------+---------------+---------+-----------+----------+-------------------+ SFJ      Full                                                             +---------+---------------+---------+-----------+----------+-------------------+  FV Prox  Full                                                             +---------+---------------+---------+-----------+----------+-------------------+ FV Mid   Full                                                             +---------+---------------+---------+-----------+----------+-------------------+ FV DistalFull                                                             +---------+---------------+---------+-----------+----------+-------------------+ PFV      Full                                                             +---------+---------------+---------+-----------+----------+-------------------+ POP      Full           Yes      Yes                                      +---------+---------------+---------+-----------+----------+-------------------+ PTV      Full                                                             +---------+---------------+---------+-----------+----------+-------------------+ PERO                                                  Not well visualized +---------+---------------+---------+-----------+----------+-------------------+     Summary: BILATERAL: - No evidence of deep vein thrombosis seen in the lower extremities, bilaterally. - No evidence of superficial venous thrombosis in the lower extremities, bilaterally. -No evidence of popliteal cyst, bilaterally.   *See table(s) above for measurements and observations.    Preliminary    _______________________________________________________________________________________________________ Latest  Blood pressure (!) 150/90, pulse 67, temperature 98.9 F (37.2  C), temperature source Oral, resp. rate 16, SpO2 96 %.   Review of Systems:    Pertinent positives include:  Fatigue, right foot pain and swelling  Constitutional:  No weight loss, night sweats, Fevers, chills,  weight loss  HEENT:  No headaches, Difficulty swallowing,Tooth/dental problems,Sore throat,  No sneezing, itching, ear ache, nasal congestion, post nasal drip,  Cardio-vascular:  No chest pain, Orthopnea, PND, anasarca, dizziness, palpitations.no Bilateral lower extremity swelling  GI:  No heartburn, indigestion, abdominal pain, nausea, vomiting, diarrhea,  change in bowel habits, loss of appetite, melena, blood in stool, hematemesis Resp:  no shortness of breath at rest. No dyspnea on exertion, No excess mucus, no productive cough, No non-productive cough, No coughing up of blood.No change in color of mucus.No wheezing. Skin:  no rash or lesions. No jaundice GU:  no dysuria, change in color of urine, no urgency or frequency. No straining to urinate.  No flank pain.  Musculoskeletal:  No joint pain or no joint swelling. No decreased range of motion. No back pain.  Psych:  No change in mood or affect. No depression or anxiety. No memory loss.  Neuro: no localizing neurological complaints, no tingling, no weakness, no double vision, no gait abnormality, no slurred speech, no confusion  All systems reviewed and apart from [place] all are negative _______________________________________________________________________________________________ Past Medical History:   Past Medical History:  Diagnosis Date  . GERD (gastroesophageal reflux disease)   . Hypertension   . Sleep apnea    Cpap does not aways use      Past Surgical History:  Procedure Laterality Date  . COLONOSCOPY WITH PROPOFOL N/A 08/17/2014   Procedure: COLONOSCOPY WITH PROPOFOL;  Surgeon: Garlan Fair, MD;  Location: [facility];  Service: Endoscopy;  Laterality: N/A;    Social History:  Ambulatory   independently       reports that he has never smoked. He does not have any smokeless tobacco history on file. He reports current alcohol use. He reports that he does not use drugs.   Family History:   Family History  Problem Relation Age of Onset  . CAD Father    ______________________________________________________________________________________________ Allergies: Allergies  Allergen Reactions  . Codeine Nausea And Vomiting     Prior to Admission medications   Medication Sig Start Date End Date Taking? Authorizing Provider  amLODipine (NORVASC) 2.5 MG tablet Take 2.5 mg by mouth daily.    [provider]  aspirin EC 81 MG tablet Take 81 mg by mouth daily.    [provider]  chlorthalidone (HYGROTON) 25 MG tablet Take 25 mg by mouth daily.    [provider]  GRAPE SEED CR PO Take 1 tablet by mouth daily.    [provider]  Omega-3 Fatty Acids (FISH OIL PO) Take 1 capsule by mouth daily.    [provider]  omeprazole (PRILOSEC) 40 MG capsule Take 40 mg by mouth 2 (two) times daily.    [provider]  psyllium (HYDROCIL/METAMUCIL) 95 % PACK Take 1 packet by mouth daily.    [provider]  telmisartan (MICARDIS) 80 MG tablet Take 80 mg by mouth every morning.    [provider]    ___________________________________________________________________________________________________ Physical Exam: Vitals with BMI 04/01/2020 04/01/2020 08/17/2014  Height - - -  Weight - - -  BMI - - -  Systolic 376 283 151  Diastolic 90 83 70  Pulse 67 78 -     1. General:  in No  Acute distress   well  -appearing 2. Psychological: Alert and  Oriented 3. Head/ENT:    Dry Mucous Membranes                          Head Non traumatic, neck supple                           Poor Dentition 4. SKIN:   decreased Skin turgor,  Skin  clean Dry erythematous, with sig swelling              5. Heart: Regular rate and  rhythm  mild Murmur, no Rub or gallop 6. Lungs:  no wheezes or crackles   7. Abdomen: Soft, non-tender, Non distended  bowel sounds present 8. Lower extremities: no clubbing, cyanosis, 2+ edema 9. Neurologically Grossly intact, moving all 4 extremities equally   10. MSK: Normal range of motion    Chart has been reviewed  ______________________________________________________________________________________________  Assessment/Plan  75 y.o. male with medical history significant of OSA, HTN, GERD     Admitted for right foot ulcer and cellulitis  Present on Admission: . Cellulitis/right foot ulcer - -admit per cellulitis protocol will         continue current antibiotic choice  vancomycin       plain films showed no evidence of air no evidence of osteomyelitis   no   foreign   objects      tetanus shot has been updated.     Will obtain MRSA screening,     obtain blood cultures      Given foot ulcer order ABI  sed rate and CRP will need  Additional imaging if elevated Will need wound care follow up     further antibiotic adjustment pending above results  . Essential hypertension - continue home meds   Mild heart murmur and bilateral leg edema will check Echo  . GERD (gastroesophageal reflux disease)- continue PPI, hx of Berrets esophagus sp recent therapy at [facility]  Other plan as per orders.  DVT prophylaxis:  SCD    Code Status:    Code Status: Not on file FULL CODE  as per patient  I had personally discussed CODE STATUS with patient     Family Communication:   Family not at  Bedside    Disposition Plan:     To home once workup is complete and patient is stable   Following barriers for discharge:  Pain controlled with PO medications                            Wound improving and follow up established                         Transition of care consulted                                    Wound care  consulted                    Consults called: none, pending  further work up  Admission status:  ED Disposition    ED Disposition Condition [place]: [place] [[id number]]  Level of Care: Med-Surg [16]  I expect the patient will be discharged within 24 hours: No (not a candidate for 5C-Observation unit)  Covid Evaluation: Asymptomatic Screening Protocol (No Symptoms)  Diagnosis: Cellulitis [811914]  Admitting Physician: Toy Baker [[id number]]  Attending Physician: Toy Baker [[id number]]       Obs   Level of care    medical floor     No results found for: SARSCOV2NAA   Precautions: admitted as asymptomatic screening protocol  PPE: Used by the provider:   N95  eye Goggles,  Gloves  Toy Baker 04/01/2020, 8:38 PM     [facility]     after 2 AM please page floor coverage PA If 7AM-7PM, please contact the day team taking care of the patient using [email or web address]   Patient was evaluated in the context of the global COVID-19 pandemic, which necessitated consideration that the patient might be at risk for infection with the SARS-CoV-2 virus that causes COVID-19. Institutional protocols and algorithms that pertain to the evaluation of patients at risk for COVID-19 are in a state of rapid change based on information released by regulatory bodies including the CDC and federal and state organizations. These policies and algorithms were followed during the patient's care.

## 2020-04-01 NOTE — ED Triage Notes (Signed)
Pt here with reports of increased pain and swelling to RLE. Pt endorses some edema to LLE as well over the last few days. Went to PCP and sent here for possible cellulitis and DVT rule out.

## 2020-04-01 NOTE — Progress Notes (Signed)
Lower extremity venous has been completed.   Preliminary results in CV Proc.   Abram Sander 04/01/2020 4:00 PM

## 2020-04-02 ENCOUNTER — Observation Stay (HOSPITAL_BASED_OUTPATIENT_CLINIC_OR_DEPARTMENT_OTHER): Payer: Medicare Other

## 2020-04-02 ENCOUNTER — Observation Stay (HOSPITAL_COMMUNITY): Payer: Medicare Other

## 2020-04-02 DIAGNOSIS — M79604 Pain in right leg: Secondary | ICD-10-CM | POA: Diagnosis not present

## 2020-04-02 DIAGNOSIS — L0390 Cellulitis, unspecified: Secondary | ICD-10-CM | POA: Diagnosis not present

## 2020-04-02 DIAGNOSIS — B958 Unspecified staphylococcus as the cause of diseases classified elsewhere: Secondary | ICD-10-CM | POA: Diagnosis present

## 2020-04-02 DIAGNOSIS — I352 Nonrheumatic aortic (valve) stenosis with insufficiency: Secondary | ICD-10-CM

## 2020-04-02 DIAGNOSIS — K219 Gastro-esophageal reflux disease without esophagitis: Secondary | ICD-10-CM | POA: Diagnosis present

## 2020-04-02 DIAGNOSIS — Z7982 Long term (current) use of aspirin: Secondary | ICD-10-CM | POA: Diagnosis not present

## 2020-04-02 DIAGNOSIS — Z23 Encounter for immunization: Secondary | ICD-10-CM | POA: Diagnosis not present

## 2020-04-02 DIAGNOSIS — E876 Hypokalemia: Secondary | ICD-10-CM | POA: Diagnosis present

## 2020-04-02 DIAGNOSIS — R011 Cardiac murmur, unspecified: Secondary | ICD-10-CM | POA: Diagnosis not present

## 2020-04-02 DIAGNOSIS — Z79899 Other long term (current) drug therapy: Secondary | ICD-10-CM | POA: Diagnosis not present

## 2020-04-02 DIAGNOSIS — L97509 Non-pressure chronic ulcer of other part of unspecified foot with unspecified severity: Secondary | ICD-10-CM

## 2020-04-02 DIAGNOSIS — L97511 Non-pressure chronic ulcer of other part of right foot limited to breakdown of skin: Secondary | ICD-10-CM | POA: Diagnosis not present

## 2020-04-02 DIAGNOSIS — M7989 Other specified soft tissue disorders: Secondary | ICD-10-CM | POA: Diagnosis not present

## 2020-04-02 DIAGNOSIS — I350 Nonrheumatic aortic (valve) stenosis: Secondary | ICD-10-CM | POA: Diagnosis present

## 2020-04-02 DIAGNOSIS — L97519 Non-pressure chronic ulcer of other part of right foot with unspecified severity: Secondary | ICD-10-CM | POA: Diagnosis present

## 2020-04-02 DIAGNOSIS — G4733 Obstructive sleep apnea (adult) (pediatric): Secondary | ICD-10-CM | POA: Diagnosis present

## 2020-04-02 DIAGNOSIS — R21 Rash and other nonspecific skin eruption: Secondary | ICD-10-CM | POA: Diagnosis present

## 2020-04-02 DIAGNOSIS — L03115 Cellulitis of right lower limb: Secondary | ICD-10-CM | POA: Diagnosis present

## 2020-04-02 DIAGNOSIS — Z8249 Family history of ischemic heart disease and other diseases of the circulatory system: Secondary | ICD-10-CM | POA: Diagnosis not present

## 2020-04-02 DIAGNOSIS — Z20822 Contact with and (suspected) exposure to covid-19: Secondary | ICD-10-CM | POA: Diagnosis present

## 2020-04-02 DIAGNOSIS — Z885 Allergy status to narcotic agent status: Secondary | ICD-10-CM | POA: Diagnosis not present

## 2020-04-02 DIAGNOSIS — I10 Essential (primary) hypertension: Secondary | ICD-10-CM | POA: Diagnosis present

## 2020-04-02 LAB — TSH: TSH: 0.872 u[IU]/mL (ref 0.350–4.500)

## 2020-04-02 LAB — COMPREHENSIVE METABOLIC PANEL
ALT: 12 U/L (ref 0–44)
AST: 17 U/L (ref 15–41)
Albumin: 2.9 g/dL — ABNORMAL LOW (ref 3.5–5.0)
Alkaline Phosphatase: 54 U/L (ref 38–126)
Anion gap: 5 (ref 5–15)
BUN: 12 mg/dL (ref 8–23)
CO2: 26 mmol/L (ref 22–32)
Calcium: 8.3 mg/dL — ABNORMAL LOW (ref 8.9–10.3)
Chloride: 107 mmol/L (ref 98–111)
Creatinine, Ser: 0.9 mg/dL (ref 0.61–1.24)
GFR, Estimated: >60 mL/min (ref >60)
Glucose, Bld: 156 mg/dL — ABNORMAL HIGH (ref 70–99)
Potassium: 3.3 mmol/L — ABNORMAL LOW (ref 3.5–5.1)
Sodium: 138 mmol/L (ref 135–145)
Total Bilirubin: 0.6 mg/dL (ref 0.3–1.2)
Total Protein: 5.8 g/dL — ABNORMAL LOW (ref 6.5–8.1)

## 2020-04-02 LAB — ECHOCARDIOGRAM COMPLETE
AR max vel: 1.51 cm2
AV Area VTI: 1.83 cm2
AV Area mean vel: 1.65 cm2
AV Mean grad: 9 mmHg
AV Peak grad: 20.8 mmHg
Ao pk vel: 2.28 m/s
Area-P 1/2: 3.42 cm2
S' Lateral: 3 cm

## 2020-04-02 LAB — CBC WITH DIFFERENTIAL/PLATELET
Abs Immature Granulocytes: 0.02 10*3/uL (ref 0.00–0.07)
Basophils Absolute: 0 10*3/uL (ref 0.0–0.1)
Basophils Relative: 1 %
Eosinophils Absolute: 0.3 10*3/uL (ref 0.0–0.5)
Eosinophils Relative: 4 %
HCT: 36.8 % — ABNORMAL LOW (ref 39.0–52.0)
Hemoglobin: 12.5 g/dL — ABNORMAL LOW (ref 13.0–17.0)
Immature Granulocytes: 0 %
Lymphocytes Relative: 18 %
Lymphs Abs: 1.2 10*3/uL (ref 0.7–4.0)
MCH: 30.4 pg (ref 26.0–34.0)
MCHC: 34 g/dL (ref 30.0–36.0)
MCV: 89.5 fL (ref 80.0–100.0)
Monocytes Absolute: 0.7 10*3/uL (ref 0.1–1.0)
Monocytes Relative: 11 %
Neutro Abs: 4.5 10*3/uL (ref 1.7–7.7)
Neutrophils Relative %: 66 %
Platelets: 208 10*3/uL (ref 150–400)
RBC: 4.11 MIL/uL — ABNORMAL LOW (ref 4.22–5.81)
RDW: 13.3 % (ref 11.5–15.5)
WBC: 6.7 10*3/uL (ref 4.0–10.5)
nRBC: 0 % (ref 0.0–0.2)

## 2020-04-02 LAB — HIV ANTIBODY (ROUTINE TESTING W REFLEX): HIV Screen 4th Generation wRfx: NONREACTIVE

## 2020-04-02 LAB — PHOSPHORUS: Phosphorus: 2.8 mg/dL (ref 2.5–4.6)

## 2020-04-02 LAB — HEPATIC FUNCTION PANEL
ALT: 13 U/L (ref 0–44)
AST: 17 U/L (ref 15–41)
Albumin: 2.8 g/dL — ABNORMAL LOW (ref 3.5–5.0)
Alkaline Phosphatase: 55 U/L (ref 38–126)
Bilirubin, Direct: <0.1 mg/dL (ref 0.0–0.2)
Total Bilirubin: 0.5 mg/dL (ref 0.3–1.2)
Total Protein: 5.8 g/dL — ABNORMAL LOW (ref 6.5–8.1)

## 2020-04-02 LAB — MAGNESIUM: Magnesium: 2.2 mg/dL (ref 1.7–2.4)

## 2020-04-02 LAB — SARS CORONAVIRUS 2 (TAT 6-24 HRS): SARS Coronavirus 2: NEGATIVE

## 2020-04-02 LAB — C-REACTIVE PROTEIN: CRP: 5.5 mg/dL — ABNORMAL HIGH (ref <1.0)

## 2020-04-02 LAB — PREALBUMIN: Prealbumin: 14.7 mg/dL — ABNORMAL LOW (ref 18–38)

## 2020-04-02 LAB — MRSA PCR SCREENING: MRSA by PCR: NEGATIVE

## 2020-04-02 LAB — SEDIMENTATION RATE: Sed Rate: 10 mm/hr (ref 0–16)

## 2020-04-02 MED ORDER — POTASSIUM CHLORIDE CRYS ER 20 MEQ PO TBCR
40.0000 meq | EXTENDED_RELEASE_TABLET | Freq: Once | ORAL | Status: AC
  Administered 2020-04-02: 40 meq via ORAL
  Filled 2020-04-02: qty 2

## 2020-04-02 NOTE — Assessment & Plan Note (Signed)
-   see cellulitis

## 2020-04-02 NOTE — Progress Notes (Signed)
  Echocardiogram 2D Echocardiogram has been performed.  Alexander Cooper 04/02/2020, 2:08 PM

## 2020-04-02 NOTE — Consult Note (Signed)
PODIATRY CONSULTATION  NAME Alexander Cooper MRN [id number] DOB [birth year] DOA 04/01/2020   Reason for consult: Ulcer right great toe.  Cellulitis RLE Chief Complaint  Patient presents with  . Leg Swelling    Consulting physician: Dr. Dwyane Dee, MD, [facility]  History of present illness: 75 y.o. male uncomplicated PMH admitted yesterday for right lower extremity swelling and edema with worsening redness that occurred over the past 48 hours prior to admission.  He was seen by his PCP and referred to the ER due to worsening appearance of his leg.  Patient started on vancomycin and Rocephin and admitted for further work-up.  Patient states that he has had a wound to the bottom of his right great toe for approximately 2 weeks as far as the patient can remember.  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  X-rays taken do not indicate any bony pathology or osseous erosion that would be consistent with findings of osteomyelitis.  Podiatry consulted for further evaluation  Past Medical History:  Diagnosis Date  . GERD (gastroesophageal reflux disease)   . Hypertension   . Sleep apnea    Cpap does not aways use    CBC Latest Ref Rng & Units 04/02/2020 04/01/2020 04/01/2020  WBC 4.0 - 10.5 K/uL 6.7 7.7 7.5  Hemoglobin 13.0 - 17.0 g/dL 12.5(L) 14.0 13.5  Hematocrit 39.0 - 52.0 % 36.8(L) 42.2 41.1  Platelets 150 - 400 K/uL 208 223 235    BMP Latest Ref Rng & Units 04/02/2020 04/01/2020  Glucose 70 - 99 mg/dL 156(H) 84  BUN 8 - 23 mg/dL 12 13  Creatinine 0.61 - 1.24 mg/dL 0.90 0.93  Sodium 135 - 145 mmol/L 138 138  Potassium 3.5 - 5.1 mmol/L 3.3(L) 3.7  Chloride 98 - 111 mmol/L 107 104  CO2 22 - 32 mmol/L 26 27  Calcium 8.9 - 10.3 mg/dL 8.3(L) 8.9          Physical Exam: General: The patient is alert and oriented x3 in no acute distress.   Dermatology: Ulcer noted to the right hallux plantar IPJ.  The wound was  expressed and palpated and I was unable to get any purulence from the area.  Periwound is callused.  Wound base is mostly fibrotic tissue.  No malodor noted.  Negative for any significant drainage.  Wound appears limited to subcutaneous tissue.  It does not probe to bone.  Measures approximately 1.0 x 1.0 x 0.2 cm (LxWxD).   VAS [place] ABI w/wo TBI 04/02/2020: Right: Resting right ankle-brachial index is within normal range. No  evidence of significant right lower extremity arterial disease.   Left: Resting left ankle-brachial index is within normal range. No  evidence of significant left lower extremity arterial disease.   Vascular: Heavy edema noted right lower extremity up to the level of the knee.  Skin is warm to touch and even with the contralateral limb.  Mild to moderate erythema noted  Neurological: Epicritic and protective threshold grossly intact bilaterally.   Musculoskeletal Exam: No structural deformity noted.  Radiographic exam 04/01/2020 RT foot:  Soft tissue ulcer subjacent to the first digit interphalangeal joint. No radiopaque foreign body. No evidence of associated osteomyelitis. No erosion or bony destruction. No fracture. Mild osteoarthritis in the midfoot and first metatarsal phalangeal joint. Moderate plantar calcaneal spur. No Achilles tendon enthesophyte. Dorsal soft tissue edema overlies the foot.  IMPRESSION: 1. Soft tissue ulcer subjacent to the  first digit interphalangeal joint. No evidence of osteomyelitis. No radiopaque foreign body. 2. Dorsal soft tissue edema.  ASSESSMENT/PLAN OF CARE Cellulitis with edema RLE.  Ulcer right hallux -Continue IV antibiotics as per admitting physician -Excisional debridement of the wound was performed today at bedside to remove the periwound callus and fibrotic debris from the wound.  Patient tolerated it well.  Post debridement evaluation indicates that the wound is stable and probes to the level of subcutaneous tissue (please  see above photo). -At this point I do not feel that additional imaging is warranted -Recommend good conservative aggressive wound care in the outpatient setting -In the meantime, patient may be weightbearing as tolerated in a postsurgical shoe.  Order placed for a postsurgical shoe -For the moment recommend triple antibiotic and a Band-Aid daily to the area-podiatry to sign off.  Follow-up within 1 week in office post discharge   Thank you for the consult.  Please contact me directly with any questions or concerns.  Cell [phone]   Edrick Kins, DPM [facility]  Dr. Edrick Kins, DPM    [address], [place] [address]                Office [phone]  Fax [phone]

## 2020-04-02 NOTE — Hospital Course (Addendum)
Alexander Cooper is a 75 yo male with PMH hypertension, sleep apnea, GERD who presented to the hospital with worsening redness, pain, swelling in his right leg.  He has not been on any antibiotics prior to admission and was referred to the ER due to worsening appearance of his leg. He had denied any injury that he knew of to his foot and denied any prior history of similar episode. He underwent further evaluation in the ER and his presentation appeared consistent with right lower extremity cellulitis.  He was started on vancomycin and Rocephin and admitted for further work-up. Podiatry was consulted regarding regarding a wound noted to the plantar aspect of right great toe; he underwent bedside debridement with podiatry.  Other work-up consisted of ABIs bilaterally as well as bilateral lower extremity duplex.  There was no significant vascular disease nor evidence of DVT bilaterally. He also then developed a erythematous nodule over his right knee and underwent ultrasound on 04/03/2020.  This revealed a periarticular fluid collection and due to this ortho surgery was then consulted.  Aspiration was attempted however there was no fluid able to be obtained.  He was placed in a compression wrap around his knee and recommended to continue supportive care and follow-up if any further swelling or worsening.  He will continue antibiotic ointment application daily to his right foot incision and follow-up outpatient with podiatry as well.  He was discharged with doxycycline to complete a course.  Clinically he had shown good improvement prior to discharge while on IV antibiotics during hospitalization.

## 2020-04-02 NOTE — Assessment & Plan Note (Signed)
-  Followed closely outpatient

## 2020-04-02 NOTE — Consult Note (Signed)
WOC Nurse Consult Note: Patient receiving care in [facility] [phone]. Reason for Consult: right great toe wound Wound type: likely repetitive movement, rubbing, trauma induced Pressure Injury POA: Yes/No/NA Measurement: 0.5 cm x 1 cm Wound bed: dry, darkened, not opened, I did probe it with a swab. Drainage (amount, consistency, odor) none Periwound: callus Dressing procedure/placement/frequency:  Twice daily application of iodine, allow to air dry. Monitor the wound area(s) for worsening of condition such as: Signs/symptoms of infection,  Increase in size,  Development of or worsening of odor, Development of pain, or increased pain at the affected locations.  Notify the medical team if any of these develop.  Thank you for the consult. [place] nurse will not follow at this time.  Please re-consult the [place] team if needed.  Val Riles, RN, MSN, CWOCN, CNS-BC, pager [phone]

## 2020-04-02 NOTE — Assessment & Plan Note (Addendum)
-  After further evaluation, patient does have injury to the plantar aspect of his right large toe with mild purulent drainage noted on the interdigital aspect.  He has associated erythema and edema of the right lower extremity likely originating from his foot/injury.  He is active and on his feet most all day (works at [employer] and also walks large distances almost daily) - appearance is not well demarcated, favor a Staph infection; treated with vancomycin and Rocephin during hospitalization - ABI normal and LE duplex negative for DVT bilaterally - s/p bedside I&D with podiatry on 04/02/20 to ulceration on plantar surface right great toe - now has developed right lower knee erythematous and tender lesion; U/S states periarticular fluid (5.7 x 1.2 x 3.4 cm); possibly bursitis; orthopedic surgery was consulted and aspiration attempt was unsuccessful for obtaining fluid.  He was recommended to continue with knee compression wrap and outpatient follow-up if still needed if no improvement -Patient will also follow-up with podiatry in approximately 1 week -Discharged on doxycycline to complete course

## 2020-04-02 NOTE — Progress Notes (Signed)
PT Screen Note  Patient Details Name: Alexander Cooper MRN: [id number] DOB: 11-02-1945   Screen Treatment:    Reason Eval/Treat Not Completed: PT screened, no needs identified, will sign off   Spoke with OT this morning who performed Eval and pt was independent with gait and stairs.  RN also reports pt mobilizing well.  Spoke with pt who reports walking well and declines need for PT.  States pain is bearable and does not need AD to ambulate. Of note pt worked the day prior to coming to hospital.   PT will sign off at this time.  Abran Richard, PT Acute Rehab Services Pager [phone] [facility] [place] 04/02/2020, 12:14 PM

## 2020-04-02 NOTE — Assessment & Plan Note (Signed)
-  Continue amlodipine and irbesartan

## 2020-04-02 NOTE — Assessment & Plan Note (Signed)
Continue Protonix °

## 2020-04-02 NOTE — Evaluation (Signed)
Occupational Therapy Evaluation/Discharge Patient Details Name: Alexander Cooper MRN: [id number] DOB: 1945/05/19 Today's Date: 04/02/2020    History of Present Illness Alexander Cooper is a 75 y.o. male with medical history significant of OSA, HTN, GERD who presented with R LE swelling/pain and ulcer on toe, as well as L LE swelling. Negative for DVT, suspected cellulitis   Clinical Impression   PTA, pt lives alone and reports being very active, independent in all daily tasks without use of AD. Pt works at [employer] and enjoys traveling to historic sites. Pt presents now with improving R LE swelling/pain though still present. Pt reports he was working yesterday prior to arrival at ED. Pt able to demonstrate ADLs and mobility Independently with good mgmt of IV pole. Pt has multiple steps outside and inside home - able to demonstrate 3-4 steps using railing without difficulty. Limited in assessing further steps due to IV line constraints. Reinforced IV line safety for independent mobility to/from bathroom with pt verbalizing understanding. Anticipate no safety concerns in pt discharging home. No further skilled OT services needed at acute level or on discharge.     Follow Up Recommendations  No OT follow up    Equipment Recommendations  None recommended by OT    Recommendations for Other Services       Precautions / Restrictions Precautions Precautions: Fall Restrictions Weight Bearing Restrictions: No      Mobility Bed Mobility Overal bed mobility: Independent                  Transfers Overall transfer level: Independent Equipment used: None                  Balance Overall balance assessment: No apparent balance deficits (not formally assessed)                                         ADL either performed or assessed with clinical judgement   ADL Overall ADL's : Independent                                       General ADL  Comments: Able to don socks without issue sitting EOB, mobility in room/hallway and able to manage IV pole well. Pt able to complete 3-4 steps with use of railing, no LOB     Vision Baseline Vision/History: Wears glasses Wears Glasses: At all times Patient Visual Report: No change from baseline Vision Assessment?: No apparent visual deficits     Perception     Praxis      Pertinent Vitals/Pain Pain Assessment: Faces Faces Pain Scale: Hurts a little bit Pain Location: R LE Pain Descriptors / Indicators: Discomfort Pain Intervention(s): Monitored during session     Hand Dominance Right   Extremity/Trunk Assessment Upper Extremity Assessment Upper Extremity Assessment: Overall WFL for tasks assessed   Lower Extremity Assessment Lower Extremity Assessment: RLE deficits/detail RLE Deficits / Details: R LE swelling, redness. ROM/strength WFL.   Cervical / Trunk Assessment Cervical / Trunk Assessment: Normal   Communication Communication Communication: No difficulties   Cognition Arousal/Alertness: Awake/alert Behavior During Therapy: WFL for tasks assessed/performed Overall Cognitive Status: Within Functional Limits for tasks assessed  General Comments  Encouraged continued elevation of LEs to decrease swelling, educated on safety with IV line mgmt for ability to navigate to/from bathroom independently    Exercises     Shoulder Instructions      Home Living Family/patient expects to be discharged to:: Private residence Living Arrangements: Alone   Type of Home: House Home Access: Stairs to enter [employer] of Steps: 5 on sidewalk + 4 Entrance Stairs-Rails: Right;Left Home Layout: Multi-level Alternate Level Stairs-Number of Steps: flight   Bathroom Shower/Tub: Tub/shower unit;Walk-in shower   Bathroom Toilet: Standard     Home Equipment: Shower seat;Hand held shower head          Prior  Functioning/Environment Level of Independence: Independent        Comments: was working at [employer] to keep busy. Independent in all tasks, was splitting firewood up until admission. Enjoys traveling to historic sites        OT Problem List:        OT Treatment/Interventions:      OT Goals(Current goals can be found in the care plan section) Acute Rehab OT Goals Patient Stated Goal: travel to 80% of historic sites in [place] this year OT Goal Formulation: All assessment and education complete, DC therapy  OT Frequency:     Barriers to D/C:            Co-evaluation              AM-PAC OT "6 Clicks" Daily Activity     Outcome Measure Help from another person eating meals?: None Help from another person taking care of personal grooming?: None Help from another person toileting, which includes using toliet, bedpan, or urinal?: None Help from another person bathing (including washing, rinsing, drying)?: None Help from another person to put on and taking off regular upper body clothing?: None Help from another person to put on and taking off regular lower body clothing?: None 6 Click Score: 24   End of Session Equipment Utilized During Treatment: Gait belt Nurse Communication: Mobility status  Activity Tolerance: Patient tolerated treatment well Patient left: in bed;with call bell/phone within reach                   Time: 5956-3875 OT Time Calculation (min): 31 min Charges:  OT General Charges $OT Visit: 1 Visit OT Evaluation $OT Eval Low Complexity: 1 Low OT Treatments $Self Care/Home Management : 8-22 mins  Alexander Cooper, OTR/L Acute Rehab Services Office: [phone]  Alexander Cooper 04/02/2020, 8:44 AM

## 2020-04-02 NOTE — Assessment & Plan Note (Signed)
-  Replete and recheck as needed 

## 2020-04-02 NOTE — Progress Notes (Signed)
PROGRESS NOTE    Alexander Cooper   HDQ:[id number]  DOB: 1945-09-14  DOA: 04/01/2020     0  PCP: Lavone Orn, MD  CC: Right foot pain and leg swelling  Hospital Course: Alexander Cooper is a 75 yo male with PMH hypertension, sleep apnea, GERD who presented to the hospital with worsening redness, pain, swelling in his right leg.  He has not been on any antibiotics prior to admission and was referred to the ER due to worsening appearance of his leg. He had denied any injury that he knew of to his foot and denied any prior history of similar episode. He underwent further evaluation in the ER and his presentation appeared consistent with right lower extremity cellulitis.  He was started on vancomycin and Rocephin and admitted for further work-up.   Interval History:  Seen in his room this morning; admitted overnight.  The redness in his right leg has already started improving after being started on IV antibiotics yesterday. He denied any obvious or known injury to his foot that he can remember however there is an obvious injury on the plantar aspect of his right foot great toe.  There is also some purulent drainage noted on the toe.  He is rather active and walks daily as well as continues to work at the [employer] close to where he lives in [place].  Explained plan will be to continue IV antibiotics and have further input from podiatry given purulent drainage noted.  ROS: Constitutional: negative for chills and fevers, Respiratory: negative for cough, Cardiovascular: negative for chest pain and Gastrointestinal: negative for abdominal pain  Assessment & Plan: * Cellulitis -After further evaluation, patient does have injury to the plantar aspect of his right large toe with mild purulent drainage noted on the interdigital aspect.  He has associated erythema and edema of the right lower extremity likely originating from his foot/injury.  He is active and on his feet most  all day (works at [employer] and also walks large distances almost daily) - appearance is not well demarcated, favor a Staph infection; but continue Vanc/CTX for now.  - ABI normal and LE duplex negative for DVT bilaterally - given pronounced induration/eschar and mild pus in toe will see if podiatry recommends further imaging in case of need for possible I&D or continuing abx alone - follow up cultures  Foot ulcer ([place]) - see cellulitis   Hypokalemia -Replete and recheck as needed  Mild aortic stenosis -Patient has known murmur.  Echo ordered on admission and shows mild aortic stenosis  GERD (gastroesophageal reflux disease) -Continue Protonix  OSA (obstructive sleep apnea) -Followed closely outpatient   Essential hypertension -Continue amlodipine and irbesartan    Old records reviewed in assessment of this patient  Antimicrobials: Rocephin 04/01/20 >> current Vanc 04/01/20 >> current   DVT prophylaxis: SCDs Start: 04/01/20 2108   Code Status:   Code Status: Full Code Family Communication:   Disposition Plan: Status is: Inpatient  Remains inpatient appropriate because:Ongoing diagnostic testing needed not appropriate for outpatient work up, IV treatments appropriate due to intensity of illness or inability to take PO and Inpatient level of care appropriate due to severity of illness   Dispo: The patient is from: Home              Anticipated d/c is to: Home              Patient currently is not medically stable to d/c.   Difficult  to place patient No  Risk of unplanned readmission score:     Objective: Blood pressure 123/66, pulse (!) 58, temperature 98.4 F (36.9 C), temperature source Oral, resp. rate 18, SpO2 97 %.  Examination: General appearance: alert, cooperative and no distress Head: Normocephalic, without obvious abnormality, atraumatic Eyes: EOMI Lungs: clear to auscultation bilaterally Heart: regular rate and rhythm, S1, S2 normal and 3/6  HSM heard throughout precordium Abdomen: normal findings: bowel sounds normal and soft, non-tender Extremities: Plantar aspect right foot great toe noted with approximately 3 cm horizontal significantly indurated lesion and interdigital aspect of toe noted with purulent drainage.  Right lower extremity has edema throughout with associated erythema and calor which has improved since admission Skin: Erythema scattered with improvement noted in right lower extremity.  No clear demarcations Neurologic: Grossly normal  Consultants:   Podiatry  Procedures:     Data Reviewed: I have personally reviewed following labs and imaging studies Results for orders placed or performed during the hospital encounter of 04/01/20 (from the past 24 hour(s))  SARS CORONAVIRUS 2 (TAT 6-24 HRS) Nasopharyngeal Nasopharyngeal Swab     Status: None   Collection Time: 04/01/20  6:00 PM   Specimen: Nasopharyngeal Swab  Result Value Ref Range   SARS Coronavirus 2 NEGATIVE NEGATIVE  Blood culture (routine x 2)     Status: None (Preliminary result)   Collection Time: 04/01/20  6:28 PM   Specimen: BLOOD  Result Value Ref Range   Specimen Description BLOOD BLOOD RIGHT FOREARM    Special Requests      BOTTLES DRAWN AEROBIC AND ANAEROBIC Blood Culture results may not be optimal due to an inadequate volume of blood received in culture bottles   Culture      NO GROWTH < 24 HOURS Performed at [facility], [place] [address] [place], [place] [address]    Report Status PENDING   Differential     Status: None   Collection Time: 04/01/20  6:31 PM  Result Value Ref Range   Neutrophils Relative % 71 %   Neutro Abs 5.4 1.7 - 7.7 K/uL   Lymphocytes Relative 16 %   Lymphs Abs 1.2 0.7 - 4.0 K/uL   Monocytes Relative 9 %   Monocytes Absolute 0.7 0.1 - 1.0 K/uL   Eosinophils Relative 3 %   Eosinophils Absolute 0.2 0.0 - 0.5 K/uL   Basophils Relative 1 %   Basophils Absolute 0.0 0.0 - 0.1 K/uL   Immature Granulocytes 0  %   Abs Immature Granulocytes 0.02 0.00 - 0.07 K/uL  CBC     Status: None   Collection Time: 04/01/20  6:31 PM  Result Value Ref Range   WBC 7.7 4.0 - 10.5 K/uL   RBC 4.62 4.22 - 5.81 MIL/uL   Hemoglobin 14.0 13.0 - 17.0 g/dL   HCT 42.2 39.0 - 52.0 %   MCV 91.3 80.0 - 100.0 fL   MCH 30.3 26.0 - 34.0 pg   MCHC 33.2 30.0 - 36.0 g/dL   RDW 13.5 11.5 - 15.5 %   Platelets 223 150 - 400 K/uL   nRBC 0.0 0.0 - 0.2 %  Lactic acid, plasma     Status: None   Collection Time: 04/01/20  6:35 PM  Result Value Ref Range   Lactic Acid, Venous 0.9 0.5 - 1.9 mmol/L  Blood culture (routine x 2)     Status: None (Preliminary result)   Collection Time: 04/01/20  6:36 PM   Specimen: BLOOD RIGHT  HAND  Result Value Ref Range   Specimen Description BLOOD RIGHT HAND    Special Requests      BOTTLES DRAWN AEROBIC AND ANAEROBIC Blood Culture adequate volume   Culture      NO GROWTH < 24 HOURS Performed at [facility], [address], [place], [place] [address]    Report Status PENDING   Hemoglobin A1c     Status: None   Collection Time: 04/01/20  6:51 PM  Result Value Ref Range   Hgb A1c MFr Bld 5.2 4.8 - 5.6 %   Mean Plasma Glucose 102.54 mg/dL  Lactic acid, plasma     Status: None   Collection Time: 04/01/20  8:05 PM  Result Value Ref Range   Lactic Acid, Venous 1.1 0.5 - 1.9 mmol/L  MRSA PCR Screening     Status: None   Collection Time: 04/01/20 11:50 PM   Specimen: Nasal Mucosa; Nasopharyngeal  Result Value Ref Range   MRSA by PCR NEGATIVE NEGATIVE  Sedimentation rate     Status: None   Collection Time: 04/02/20 12:45 AM  Result Value Ref Range   Sed Rate 10 0 - 16 mm/hr  C-reactive protein     Status: Abnormal   Collection Time: 04/02/20 12:45 AM  Result Value Ref Range   CRP 5.5 (H) <1.0 mg/dL  Prealbumin     Status: Abnormal   Collection Time: 04/02/20 12:45 AM  Result Value Ref Range   Prealbumin 14.7 (L) 18 - 38 mg/dL  HIV Antibody (routine testing w rflx)     Status:  None   Collection Time: 04/02/20 12:45 AM  Result Value Ref Range   HIV Screen 4th Generation wRfx Non Reactive Non Reactive  Magnesium     Status: None   Collection Time: 04/02/20 12:45 AM  Result Value Ref Range   Magnesium 2.2 1.7 - 2.4 mg/dL  Phosphorus     Status: None   Collection Time: 04/02/20 12:45 AM  Result Value Ref Range   Phosphorus 2.8 2.5 - 4.6 mg/dL  CBC WITH DIFFERENTIAL     Status: Abnormal   Collection Time: 04/02/20 12:45 AM  Result Value Ref Range   WBC 6.7 4.0 - 10.5 K/uL   RBC 4.11 (L) 4.22 - 5.81 MIL/uL   Hemoglobin 12.5 (L) 13.0 - 17.0 g/dL   HCT 36.8 (L) 39.0 - 52.0 %   MCV 89.5 80.0 - 100.0 fL   MCH 30.4 26.0 - 34.0 pg   MCHC 34.0 30.0 - 36.0 g/dL   RDW 13.3 11.5 - 15.5 %   Platelets 208 150 - 400 K/uL   nRBC 0.0 0.0 - 0.2 %   Neutrophils Relative % 66 %   Neutro Abs 4.5 1.7 - 7.7 K/uL   Lymphocytes Relative 18 %   Lymphs Abs 1.2 0.7 - 4.0 K/uL   Monocytes Relative 11 %   Monocytes Absolute 0.7 0.1 - 1.0 K/uL   Eosinophils Relative 4 %   Eosinophils Absolute 0.3 0.0 - 0.5 K/uL   Basophils Relative 1 %   Basophils Absolute 0.0 0.0 - 0.1 K/uL   Immature Granulocytes 0 %   Abs Immature Granulocytes 0.02 0.00 - 0.07 K/uL  TSH     Status: None   Collection Time: 04/02/20 12:45 AM  Result Value Ref Range   TSH 0.872 0.350 - 4.500 uIU/mL  Comprehensive metabolic panel     Status: Abnormal   Collection Time: 04/02/20 12:45 AM  Result Value Ref Range  Sodium 138 135 - 145 mmol/L   Potassium 3.3 (L) 3.5 - 5.1 mmol/L   Chloride 107 98 - 111 mmol/L   CO2 26 22 - 32 mmol/L   Glucose, Bld 156 (H) 70 - 99 mg/dL   BUN 12 8 - 23 mg/dL   Creatinine, Ser 0.90 0.61 - 1.24 mg/dL   Calcium 8.3 (L) 8.9 - 10.3 mg/dL   Total Protein 5.8 (L) 6.5 - 8.1 g/dL   Albumin 2.9 (L) 3.5 - 5.0 g/dL   AST 17 15 - 41 U/L   ALT 12 0 - 44 U/L   Alkaline Phosphatase 54 38 - 126 U/L   Total Bilirubin 0.6 0.3 - 1.2 mg/dL   GFR, Estimated >60 >60 mL/min   Anion gap 5 5 -  15  Hepatic function panel     Status: Abnormal   Collection Time: 04/02/20 12:45 AM  Result Value Ref Range   Total Protein 5.8 (L) 6.5 - 8.1 g/dL   Albumin 2.8 (L) 3.5 - 5.0 g/dL   AST 17 15 - 41 U/L   ALT 13 0 - 44 U/L   Alkaline Phosphatase 55 38 - 126 U/L   Total Bilirubin 0.5 0.3 - 1.2 mg/dL   Bilirubin, Direct <0.1 0.0 - 0.2 mg/dL   Indirect Bilirubin NOT CALCULATED 0.3 - 0.9 mg/dL    Recent Results (from the past 240 hour(s))  SARS CORONAVIRUS 2 (TAT 6-24 HRS) Nasopharyngeal Nasopharyngeal Swab     Status: None   Collection Time: 04/01/20  6:00 PM   Specimen: Nasopharyngeal Swab  Result Value Ref Range Status   SARS Coronavirus 2 NEGATIVE NEGATIVE Final    Comment: (NOTE) SARS-CoV-2 target nucleic acids are NOT DETECTED.  The SARS-CoV-2 RNA is generally detectable in upper and lower respiratory specimens during the acute phase of infection. Negative results do not preclude SARS-CoV-2 infection, do not rule out co-infections with other pathogens, and should not be used as the sole basis for treatment or other patient management decisions. Negative results must be combined with clinical observations, patient history, and epidemiological information. The expected result is Negative.  Fact Sheet for Patients: SugarRoll.be  Fact Sheet for Healthcare Providers: https://www.woods-mathews.com/  This test is not yet approved or cleared by the [place] FDA and  has been authorized for detection and/or diagnosis of SARS-CoV-2 by FDA under an Emergency Use Authorization (EUA). This EUA will remain  in effect (meaning this test can be used) for the duration of the COVID-19 declaration under Se ction 564(b)(1) of the Act, 21 U.S.C. section 360bbb-3(b)(1), unless the authorization is terminated or revoked sooner.  Performed at [facility], [place] [address], [place], [place] [address]   Blood culture (routine x 2)     Status:  None (Preliminary result)   Collection Time: 04/01/20  6:28 PM   Specimen: BLOOD  Result Value Ref Range Status   Specimen Description BLOOD BLOOD RIGHT FOREARM  Final   Special Requests   Final    BOTTLES DRAWN AEROBIC AND ANAEROBIC Blood Culture results may not be optimal due to an inadequate volume of blood received in culture bottles   Culture   Final    NO GROWTH < 24 HOURS Performed at [facility], [place] [address], [place], [place] [address]    Report Status PENDING  Incomplete  Blood culture (routine x 2)     Status: None (Preliminary result)   Collection Time: 04/01/20  6:36 PM   Specimen: BLOOD  RIGHT HAND  Result Value Ref Range Status   Specimen Description BLOOD RIGHT HAND  Final   Special Requests   Final    BOTTLES DRAWN AEROBIC AND ANAEROBIC Blood Culture adequate volume   Culture   Final    NO GROWTH < 24 HOURS Performed at [facility], [address] [place], [place] [address]    Report Status PENDING  Incomplete  MRSA PCR Screening     Status: None   Collection Time: 04/01/20 11:50 PM   Specimen: Nasal Mucosa; Nasopharyngeal  Result Value Ref Range Status   MRSA by PCR NEGATIVE NEGATIVE Final    Comment:        The GeneXpert MRSA Assay (FDA approved for NASAL specimens only), is one component of a comprehensive MRSA colonization surveillance program. It is not intended to diagnose MRSA infection nor to guide or monitor treatment for MRSA infections. Performed at [facility], [place] [address], [place], [place] [address]      Radiology Studies: DG Foot Complete Right  Result Date: 04/01/2020 CLINICAL DATA:  Ulcer with increased pain and swelling. EXAM: RIGHT FOOT COMPLETE - 3+ VIEW COMPARISON:  None. FINDINGS: Soft tissue ulcer subjacent to the first digit interphalangeal joint. No radiopaque foreign body. No evidence of associated osteomyelitis. No erosion or bony destruction. No fracture. Mild osteoarthritis in the midfoot and first  metatarsal phalangeal joint. Moderate plantar calcaneal spur. No Achilles tendon enthesophyte. Dorsal soft tissue edema overlies the foot. IMPRESSION: 1. Soft tissue ulcer subjacent to the first digit interphalangeal joint. No evidence of osteomyelitis. No radiopaque foreign body. 2. Dorsal soft tissue edema. Electronically Signed   By: Keith Rake M.D.   On: 04/01/2020 18:23   VAS [place] ABI WITH/WO TBI  Result Date: 04/02/2020 LOWER EXTREMITY DOPPLER STUDY Indications: Cellulitis & ulcer of RT great toe. High Risk Factors: Hypertension.  Comparison Study: No previous exams Performing Technologist: Rogelia Rohrer  Examination Guidelines: A complete evaluation includes at minimum, Doppler waveform signals and systolic blood pressure reading at the level of bilateral brachial, anterior tibial, and posterior tibial arteries, when vessel segments are accessible. Bilateral testing is considered an integral part of a complete examination. Photoelectric Plethysmograph (PPG) waveforms and toe systolic pressure readings are included as required and additional duplex testing as needed. Limited examinations for reoccurring indications may be performed as noted.  ABI Findings: +--------+------------------+-----+---------+--------+ Right   Rt Pressure (mmHg)IndexWaveform Comment  +--------+------------------+-----+---------+--------+ [id number]                    triphasic         +--------+------------------+-----+---------+--------+ PTA     168               1.14 triphasic         +--------+------------------+-----+---------+--------+ DP      156               1.06 biphasic          +--------+------------------+-----+---------+--------+ +--------+------------------+-----+---------+-------+ Left    Lt Pressure (mmHg)IndexWaveform Comment +--------+------------------+-----+---------+-------+ [id number]                    triphasic        +--------+------------------+-----+---------+-------+  PTA     171               1.16 triphasic        +--------+------------------+-----+---------+-------+ DP      178  1.21 biphasic         +--------+------------------+-----+---------+-------+  Summary: Right: Resting right ankle-brachial index is within normal range. No evidence of significant right lower extremity arterial disease. Left: Resting left ankle-brachial index is within normal range. No evidence of significant left lower extremity arterial disease.  *See table(s) above for measurements and observations.  Electronically signed by Monica Martinez MD on 04/02/2020 at 1:51:19 PM.   Final    ECHOCARDIOGRAM COMPLETE  Result Date: 04/02/2020    ECHOCARDIOGRAM REPORT   Patient Name:   Alexander Cooper Date of Exam: 04/02/2020 Medical Rec #:  [id number]       Height:       71.0 in Accession #:    [id number]      Weight:       208.0 lb Date of Birth:  November 01, 1945       BSA:          2.144 m Patient Age:    3 years        BP:           123/66 mmHg Patient Gender: M               HR:           61 bpm. Exam Location:  Inpatient Procedure: 2D Echo Indications:    murmur  History:        Patient has prior history of Echocardiogram examinations, most                 recent 01/06/2013. Risk Factors:Hypertension and Sleep Apnea.  Sonographer:    Johny Chess Referring Phys: [id number] [place]  Sonographer Comments: Suboptimal subcostal window. IMPRESSIONS  1. Mild aortic stenosis.  2. Left ventricular ejection fraction, by estimation, is 60 to 65%. The left ventricle has normal function. The left ventricle has no regional wall motion abnormalities. There is mild left ventricular hypertrophy. Left ventricular diastolic parameters are consistent with Grade I diastolic dysfunction (impaired relaxation).  3. Right ventricular systolic function is normal. The right ventricular size is normal.  4. The mitral valve is normal in structure. Trivial mitral valve regurgitation. No evidence of mitral  stenosis.  5. The aortic valve is tricuspid. There is moderate calcification of the aortic valve. There is moderate thickening of the aortic valve. Aortic valve regurgitation is not visualized. Mild aortic valve stenosis. Aortic valve area, by VTI measures 1.83 cm. Aortic valve mean gradient measures 9.0 mmHg. Aortic valve Vmax measures 2.28 m/s.  6. The inferior vena cava is normal in size with greater than 50% respiratory variability, suggesting right atrial pressure of 3 mmHg. FINDINGS  Left Ventricle: Left ventricular ejection fraction, by estimation, is 60 to 65%. The left ventricle has normal function. The left ventricle has no regional wall motion abnormalities. The left ventricular internal cavity size was normal in size. There is  mild left ventricular hypertrophy. Left ventricular diastolic parameters are consistent with Grade I diastolic dysfunction (impaired relaxation). Right Ventricle: The right ventricular size is normal. No increase in right ventricular wall thickness. Right ventricular systolic function is normal. Left Atrium: Left atrial size was normal in size. Right Atrium: Right atrial size was normal in size. Pericardium: There is no evidence of pericardial effusion. Mitral Valve: The mitral valve is normal in structure. Trivial mitral valve regurgitation. No evidence of mitral valve stenosis. Tricuspid Valve: The tricuspid valve is normal in structure. Tricuspid valve regurgitation is trivial. No evidence of tricuspid stenosis. Aortic Valve: The aortic valve  is tricuspid. There is moderate calcification of the aortic valve. There is moderate thickening of the aortic valve. Aortic valve regurgitation is not visualized. Mild aortic stenosis is present. Aortic valve mean gradient measures 9.0 mmHg. Aortic valve peak gradient measures 20.8 mmHg. Aortic valve area, by VTI measures 1.83 cm. Pulmonic Valve: The pulmonic valve was normal in structure. Pulmonic valve regurgitation is not visualized.  No evidence of pulmonic stenosis. Aorta: The aortic root is normal in size and structure. Venous: The inferior vena cava is normal in size with greater than 50% respiratory variability, suggesting right atrial pressure of 3 mmHg. IAS/Shunts: There is right bowing of the interatrial septum, suggestive of elevated left atrial pressure. No atrial level shunt detected by color flow Doppler.  LEFT VENTRICLE PLAX 2D LVIDd:         5.00 cm  Diastology LVIDs:         3.00 cm  LV e' medial:    9.46 cm/s LV PW:         1.20 cm  LV E/e' medial:  10.2 LV IVS:        1.00 cm  LV e' lateral:   10.80 cm/s LVOT diam:     2.10 cm  LV E/e' lateral: 8.9 LV SV:         76 LV SV Index:   35 LVOT Area:     3.46 cm  RIGHT VENTRICLE RV S prime:     15.80 cm/s TAPSE (M-mode): 2.9 cm LEFT ATRIUM             Index       RIGHT ATRIUM           Index LA diam:        4.70 cm 2.19 cm/m  RA Area:     17.30 cm LA Vol (A2C):   92.3 ml 43.05 ml/m RA Volume:   44.30 ml  20.66 ml/m LA Vol (A4C):   59.4 ml 27.71 ml/m LA Biplane Vol: 77.2 ml 36.01 ml/m  AORTIC VALVE AV Area (Vmax):    1.51 cm AV Area (Vmean):   1.65 cm AV Area (VTI):     1.83 cm AV Vmax:           228.00 cm/s AV Vmean:          141.000 cm/s AV VTI:            0.412 m AV Peak Grad:      20.8 mmHg AV Mean Grad:      9.0 mmHg LVOT Vmax:         99.40 cm/s LVOT Vmean:        67.150 cm/s LVOT VTI:          0.218 m LVOT/AV VTI ratio: 0.53  AORTA Ao Root diam: 3.20 cm Ao Asc diam:  3.40 cm MITRAL VALVE MV Area (PHT): 3.42 cm     SHUNTS MV Decel Time: 222 msec     Systemic VTI:  0.22 m MV E velocity: 96.10 cm/s   Systemic Diam: 2.10 cm MV A velocity: 111.00 cm/s MV E/A ratio:  0.87 Candee Furbish MD Electronically signed by Candee Furbish MD Signature Date/Time: 04/02/2020/2:22:00 PM    Final    VAS [place] LOWER EXTREMITY VENOUS (DVT) (ONLY [facility] & [facility] 7a-7p)  Result Date: 04/02/2020  Lower Venous DVT Study Indications: Edema.  Comparison Study: no prior Performing Technologist: Abram Sander RVS   Examination Guidelines: A complete evaluation includes B-mode imaging, spectral Doppler,  color Doppler, and power Doppler as needed of all accessible portions of each vessel. Bilateral testing is considered an integral part of a complete examination. Limited examinations for reoccurring indications may be performed as noted. The reflux portion of the exam is performed with the patient in reverse Trendelenburg.  +---------+---------------+---------+-----------+----------+-------------------+ RIGHT    CompressibilityPhasicitySpontaneityPropertiesThrombus Aging      +---------+---------------+---------+-----------+----------+-------------------+ CFV      Full           Yes      Yes                                      +---------+---------------+---------+-----------+----------+-------------------+ SFJ      Full                                                             +---------+---------------+---------+-----------+----------+-------------------+ FV Prox  Full                                                             +---------+---------------+---------+-----------+----------+-------------------+ FV Mid   Full                                                             +---------+---------------+---------+-----------+----------+-------------------+ FV DistalFull                                                             +---------+---------------+---------+-----------+----------+-------------------+ PFV      Full                                                             +---------+---------------+---------+-----------+----------+-------------------+ POP      Full           Yes      Yes                                      +---------+---------------+---------+-----------+----------+-------------------+ PTV      Full                                                              +---------+---------------+---------+-----------+----------+-------------------+ PERO  Not well visualized +---------+---------------+---------+-----------+----------+-------------------+   +---------+---------------+---------+-----------+----------+-------------------+ LEFT     CompressibilityPhasicitySpontaneityPropertiesThrombus Aging      +---------+---------------+---------+-----------+----------+-------------------+ CFV      Full           Yes      Yes                                      +---------+---------------+---------+-----------+----------+-------------------+ SFJ      Full                                                             +---------+---------------+---------+-----------+----------+-------------------+ FV Prox  Full                                                             +---------+---------------+---------+-----------+----------+-------------------+ FV Mid   Full                                                             +---------+---------------+---------+-----------+----------+-------------------+ FV DistalFull                                                             +---------+---------------+---------+-----------+----------+-------------------+ PFV      Full                                                             +---------+---------------+---------+-----------+----------+-------------------+ POP      Full           Yes      Yes                                      +---------+---------------+---------+-----------+----------+-------------------+ PTV      Full                                                             +---------+---------------+---------+-----------+----------+-------------------+ PERO                                                  Not well visualized +---------+---------------+---------+-----------+----------+-------------------+  Summary: BILATERAL: - No evidence of deep vein thrombosis seen in the lower extremities, bilaterally. - No evidence of superficial venous thrombosis in the lower extremities, bilaterally. -No evidence of popliteal cyst, bilaterally.   *See table(s) above for measurements and observations. Electronically signed by Monica Martinez MD on 04/02/2020 at 1:50:42 PM.    Final    VAS [place] ABI WITH/WO TBI  Final Result    DG Foot Complete Right  Final Result    VAS [place] LOWER EXTREMITY VENOUS (DVT) (ONLY [facility] & [facility] 7a-7p)  Final Result      Scheduled Meds: . amLODipine  2.5 mg Oral Daily  . docusate sodium  100 mg Oral BID  . irbesartan  300 mg Oral Daily  . pantoprazole  40 mg Oral Daily  . senna  1 tablet Oral BID   PRN Meds: acetaminophen **OR** acetaminophen, HYDROcodone-acetaminophen, morphine injection Continuous Infusions: . cefTRIAXone (ROCEPHIN)  IV 2 g (04/01/20 2313)  . vancomycin 1,250 mg (04/02/20 0714)     LOS: 0 days  Time spent: Greater than 50% of the 35 minute visit was spent in counseling/coordination of care for the patient as laid out in the A&P.   Dwyane Dee, MD [facility] 04/02/2020, 3:37 PM

## 2020-04-02 NOTE — CV Procedure (Signed)
ABI completed.  Results can be found under chart review under CV PROC. 04/02/2020 11:35 AM Elexius Minar RVT, RDMS

## 2020-04-02 NOTE — Plan of Care (Signed)
  Problem: Clinical Measurements: Goal: Ability to avoid or minimize complications of infection will improve Outcome: Progressing   Problem: Skin Integrity: Goal: Skin integrity will improve Outcome: Progressing   Problem: Education: Goal: Knowledge of General Education information will improve Description: Including pain rating scale, medication(s)/side effects and non-pharmacologic comfort measures Outcome: Progressing   

## 2020-04-02 NOTE — Progress Notes (Signed)
New Admission Note: ? Arrival Method: Stretcher  Mental Orientation: AXOX4 Telemetry: Box 12 Assessment: Completed Skin: Refer to flowsheet IV: Right Forearm  Pain: 0/10  Tubes: None  Safety Measures: Safety Fall Prevention Plan discussed with patient. Admission: Completed [facility] [place] Orientation: Patient has been orientated to the room, unit and the staff. Family: None  Orders have been reviewed and are being implemented. Will continue to monitor the patient. Call light has been placed within reach and bed alarm has been activated.  ? Milagros Loll, RN  Phone Number: [phone].

## 2020-04-02 NOTE — Assessment & Plan Note (Signed)
-  Patient has known murmur.  Echo ordered on admission and shows mild aortic stenosis

## 2020-04-02 NOTE — Progress Notes (Signed)
Orthopedic Tech Progress Note Patient Details:  Alexander Cooper Jun 01, 1945 [id number]  Ortho Devices Type of Ortho Device: Postop shoe/boot Ortho Device/Splint Location: Right Foot Ortho Device/Splint Interventions: Ordered   Post Interventions Instructions Provided: Adjustment of device   Estellar Cadena E Adisa Litt 04/02/2020, 7:13 PM

## 2020-04-03 ENCOUNTER — Inpatient Hospital Stay (HOSPITAL_COMMUNITY): Payer: Medicare Other

## 2020-04-03 ENCOUNTER — Encounter: Payer: Self-pay | Admitting: Internal Medicine

## 2020-04-03 DIAGNOSIS — L03115 Cellulitis of right lower limb: Secondary | ICD-10-CM | POA: Diagnosis not present

## 2020-04-03 DIAGNOSIS — L97511 Non-pressure chronic ulcer of other part of right foot limited to breakdown of skin: Secondary | ICD-10-CM | POA: Diagnosis not present

## 2020-04-03 LAB — CBC WITH DIFFERENTIAL/PLATELET
Abs Immature Granulocytes: 0.03 10*3/uL (ref 0.00–0.07)
Basophils Absolute: 0.1 10*3/uL (ref 0.0–0.1)
Basophils Relative: 1 %
Eosinophils Absolute: 0.4 10*3/uL (ref 0.0–0.5)
Eosinophils Relative: 6 %
HCT: 37 % — ABNORMAL LOW (ref 39.0–52.0)
Hemoglobin: 11.9 g/dL — ABNORMAL LOW (ref 13.0–17.0)
Immature Granulocytes: 0 %
Lymphocytes Relative: 19 %
Lymphs Abs: 1.4 10*3/uL (ref 0.7–4.0)
MCH: 30.7 pg (ref 26.0–34.0)
MCHC: 32.2 g/dL (ref 30.0–36.0)
MCV: 95.4 fL (ref 80.0–100.0)
Monocytes Absolute: 0.8 10*3/uL (ref 0.1–1.0)
Monocytes Relative: 10 %
Neutro Abs: 4.7 10*3/uL (ref 1.7–7.7)
Neutrophils Relative %: 64 %
Platelets: 222 10*3/uL (ref 150–400)
RBC: 3.88 MIL/uL — ABNORMAL LOW (ref 4.22–5.81)
RDW: 13.5 % (ref 11.5–15.5)
WBC: 7.4 10*3/uL (ref 4.0–10.5)
nRBC: 0 % (ref 0.0–0.2)

## 2020-04-03 LAB — MAGNESIUM: Magnesium: 2.2 mg/dL (ref 1.7–2.4)

## 2020-04-03 LAB — BASIC METABOLIC PANEL
Anion gap: 5 (ref 5–15)
BUN: 11 mg/dL (ref 8–23)
CO2: 26 mmol/L (ref 22–32)
Calcium: 8.4 mg/dL — ABNORMAL LOW (ref 8.9–10.3)
Chloride: 107 mmol/L (ref 98–111)
Creatinine, Ser: 0.94 mg/dL (ref 0.61–1.24)
GFR, Estimated: >60 mL/min (ref >60)
Glucose, Bld: 105 mg/dL — ABNORMAL HIGH (ref 70–99)
Potassium: 4.2 mmol/L (ref 3.5–5.1)
Sodium: 138 mmol/L (ref 135–145)

## 2020-04-03 MED ORDER — KETOROLAC TROMETHAMINE 15 MG/ML IJ SOLN
15.0000 mg | Freq: Once | INTRAMUSCULAR | Status: AC
  Administered 2020-04-03: 15 mg via INTRAVENOUS
  Filled 2020-04-03: qty 1

## 2020-04-03 MED ORDER — DOXYCYCLINE HYCLATE 100 MG PO TABS
100.0000 mg | ORAL_TABLET | Freq: Two times a day (BID) | ORAL | Status: DC
  Administered 2020-04-03: 100 mg via ORAL
  Filled 2020-04-03: qty 1

## 2020-04-03 MED ORDER — DOXYCYCLINE HYCLATE 100 MG PO TABS: 100.0000 mg | ORAL_TABLET | Freq: Two times a day (BID) | ORAL | 0 refills | Status: AC

## 2020-04-03 NOTE — Progress Notes (Signed)
Patient complained of pain and swelling on right knee. Upon assessment, patient had a knot, the size of golf ball on right knee. The site is red and tender to touch. Prn Norco given and Olevia Bowens, MD notified. Will continue to monitor.   Milagros Loll, RN.

## 2020-04-03 NOTE — Progress Notes (Signed)
[facility] night shift.  The nursing staff communicated to me that the patient has developed an area about the size of a "golf ball" on his right knee area.  He noticed that when he woke up to go to the bathroom and felt pain in the area.  However, he mentioned that the overall tension on his right lower extremity has decreased (see pictures below).  The pain is not as intense as it was earlier, but he received hydrocodone.  Toradol 15 mg IVP x1 dose ordered.      Tennis Must, MD.

## 2020-04-03 NOTE — Progress Notes (Signed)
DISCHARGE NOTE HOME Alexander Cooper to be discharged  to home  per MD order. Discussed prescriptions and follow up appointments with the patient. Prescriptions given to patient; medication list explained in detail. Patient verbalized understanding.  Skin clean, dry and intact without evidence of skin break down, no evidence of skin tears noted.  No bleeding at the aspiration site at the rt knee.,presurre dressing clean dry and intact.IV catheter discontinued intact. Site without signs and symptoms of complications. Dressing and pressure applied. Pt denies pain at the site currently. No complaints noted.  Patient free of lines, drains, and wounds.   An After Visit Summary (AVS) was printed and given to the patient. Patient escorted via wheelchair, and discharged home via private auto.  Langley, Zenon Mayo, RN

## 2020-04-03 NOTE — Plan of Care (Signed)
?  Problem: Clinical Measurements: ?Goal: Ability to avoid or minimize complications of infection will improve ?Outcome: Progressing ?  ?Problem: Skin Integrity: ?Goal: Skin integrity will improve ?Outcome: Progressing ?  ?

## 2020-04-03 NOTE — Discharge Instructions (Signed)
Continue applying antibiotic ointment to right toe daily and cover with bandaid.  Continue compression wrap and elevation to right knee; monitor for any further changes or worsening.

## 2020-04-03 NOTE — Progress Notes (Signed)
PROGRESS NOTE    LEVIE WAGES   UXN:[id number]  DOB: 1945-11-27  DOA: 04/01/2020     1  PCP: Lavone Orn, MD  CC: Right foot pain and leg swelling  Hospital Course: Mr. Reger is a 75 yo male with PMH hypertension, sleep apnea, GERD who presented to the hospital with worsening redness, pain, swelling in his right leg.  He has not been on any antibiotics prior to admission and was referred to the ER due to worsening appearance of his leg. He had denied any injury that he knew of to his foot and denied any prior history of similar episode. He underwent further evaluation in the ER and his presentation appeared consistent with right lower extremity cellulitis.  He was started on vancomycin and Rocephin and admitted for further work-up. Podiatry was consulted regarding regarding a wound noted to the plantar aspect of right great toe; he underwent bedside debridement with podiatry.  Other work-up consisted of ABIs bilaterally as well as bilateral lower extremity duplex.  There was no significant vascular disease nor evidence of DVT bilaterally. He also then developed a erythematous nodule over his right knee and underwent ultrasound on 04/03/2020.  This revealed a periarticular fluid collection and due to this ortho surgery was then consulted.    Interval History:  Overnight he developed a red painful lesion on the bottom part of his right knee.  This was further evaluated today with an ultrasound which showed a fluid collection.  After discussion, he wishes to see if able to be aspirated to further test fluid as he was more concerned with going home and following up outpatient. Case discussed with orthopedic surgery who will evaluate patient at bedside.  ROS: Constitutional: negative for chills and fevers, Respiratory: negative for cough, Cardiovascular: negative for chest pain and Gastrointestinal: negative for abdominal pain  Assessment & Plan: * Cellulitis -After further evaluation,  patient does have injury to the plantar aspect of his right large toe with mild purulent drainage noted on the interdigital aspect.  He has associated erythema and edema of the right lower extremity likely originating from his foot/injury.  He is active and on his feet most all day (works at [employer] and also walks large distances almost daily) - appearance is not well demarcated, favor a Staph infection; but continue Vanc/CTX for now.  - ABI normal and LE duplex negative for DVT bilaterally - s/p bedside I&D with podiatry on 04/02/20 to ulceration on plantar surface right great toe - now has developed right lower knee erythematous and tender lesion; U/S states periarticular fluid (5.7 x 1.2 x 3.4 cm); possibly bursitis; patient wishes for this to be aspirated for culturing rather than outpt followup; have discussed with orthopedic surgery and patient will undergo bedside aspiration and cultures sent  Foot ulcer ([place]) - see cellulitis   Hypokalemia -Replete and recheck as needed  Mild aortic stenosis -Patient has known murmur.  Echo ordered on admission and shows mild aortic stenosis  GERD (gastroesophageal reflux disease) -Continue Protonix  OSA (obstructive sleep apnea) -Followed closely outpatient   Essential hypertension -Continue amlodipine and irbesartan   Old records reviewed in assessment of this patient  Antimicrobials: Rocephin 04/01/20 >> current Vanc 04/01/20 >> current   DVT prophylaxis: SCDs Start: 04/01/20 2108   Code Status:   Code Status: Full Code Family Communication:   Disposition Plan: Status is: Inpatient  Remains inpatient appropriate because:Ongoing diagnostic testing needed not appropriate for outpatient work up, IV treatments appropriate due  to intensity of illness or inability to take PO and Inpatient level of care appropriate due to severity of illness   Dispo: The patient is from: Home              Anticipated d/c is to: Home               Patient currently is not medically stable to d/c.   Difficult to place patient No  Risk of unplanned readmission score: Unplanned Admission- Pilot do not use: 8.47   Objective: Blood pressure (!) 153/74, pulse 65, temperature 98.3 F (36.8 C), resp. rate 18, SpO2 96 %.  Examination: General appearance: alert, cooperative and no distress Head: Normocephalic, without obvious abnormality, atraumatic Eyes: EOMI Lungs: clear to auscultation bilaterally Heart: regular rate and rhythm, S1, S2 normal and 3/6 HSM heard throughout precordium Abdomen: normal findings: bowel sounds normal and soft, non-tender Extremities: Plantar aspect of right great toe now s/p debridement; erythema of right lower extremity also significantly improving.  Now a new erythematous and tender lesion noted on the lower aspect of right knee Skin: Erythema scattered with improvement noted in right lower extremity.  No clear demarcations Neurologic: Grossly normal  Consultants:   Podiatry  Orthopedic surgery  Procedures:   Bedside debridement of right great toe, 04/02/20  Data Reviewed: I have personally reviewed following labs and imaging studies Results for orders placed or performed during the hospital encounter of 04/01/20 (from the past 24 hour(s))  Basic metabolic panel     Status: Abnormal   Collection Time: 04/03/20  3:49 AM  Result Value Ref Range   Sodium 138 135 - 145 mmol/L   Potassium 4.2 3.5 - 5.1 mmol/L   Chloride 107 98 - 111 mmol/L   CO2 26 22 - 32 mmol/L   Glucose, Bld 105 (H) 70 - 99 mg/dL   BUN 11 8 - 23 mg/dL   Creatinine, Ser 0.94 0.61 - 1.24 mg/dL   Calcium 8.4 (L) 8.9 - 10.3 mg/dL   GFR, Estimated >60 >60 mL/min   Anion gap 5 5 - 15  CBC with Differential/Platelet     Status: Abnormal   Collection Time: 04/03/20  3:49 AM  Result Value Ref Range   WBC 7.4 4.0 - 10.5 K/uL   RBC 3.88 (L) 4.22 - 5.81 MIL/uL   Hemoglobin 11.9 (L) 13.0 - 17.0 g/dL   HCT 37.0 (L) 39.0 - 52.0 %   MCV  95.4 80.0 - 100.0 fL   MCH 30.7 26.0 - 34.0 pg   MCHC 32.2 30.0 - 36.0 g/dL   RDW 13.5 11.5 - 15.5 %   Platelets 222 150 - 400 K/uL   nRBC 0.0 0.0 - 0.2 %   Neutrophils Relative % 64 %   Neutro Abs 4.7 1.7 - 7.7 K/uL   Lymphocytes Relative 19 %   Lymphs Abs 1.4 0.7 - 4.0 K/uL   Monocytes Relative 10 %   Monocytes Absolute 0.8 0.1 - 1.0 K/uL   Eosinophils Relative 6 %   Eosinophils Absolute 0.4 0.0 - 0.5 K/uL   Basophils Relative 1 %   Basophils Absolute 0.1 0.0 - 0.1 K/uL   Immature Granulocytes 0 %   Abs Immature Granulocytes 0.03 0.00 - 0.07 K/uL  Magnesium     Status: None   Collection Time: 04/03/20  3:49 AM  Result Value Ref Range   Magnesium 2.2 1.7 - 2.4 mg/dL    Recent Results (from the past 240 hour(s))  SARS CORONAVIRUS 2 (  TAT 6-24 HRS) Nasopharyngeal Nasopharyngeal Swab     Status: None   Collection Time: 04/01/20  6:00 PM   Specimen: Nasopharyngeal Swab  Result Value Ref Range Status   SARS Coronavirus 2 NEGATIVE NEGATIVE Final    Comment: (NOTE) SARS-CoV-2 target nucleic acids are NOT DETECTED.  The SARS-CoV-2 RNA is generally detectable in upper and lower respiratory specimens during the acute phase of infection. Negative results do not preclude SARS-CoV-2 infection, do not rule out co-infections with other pathogens, and should not be used as the sole basis for treatment or other patient management decisions. Negative results must be combined with clinical observations, patient history, and epidemiological information. The expected result is Negative.  Fact Sheet for Patients: SugarRoll.be  Fact Sheet for Healthcare Providers: https://www.woods-mathews.com/  This test is not yet approved or cleared by the [place] FDA and  has been authorized for detection and/or diagnosis of SARS-CoV-2 by FDA under an Emergency Use Authorization (EUA). This EUA will remain  in effect (meaning this test can be used) for the  duration of the COVID-19 declaration under Se ction 564(b)(1) of the Act, 21 U.S.C. section 360bbb-3(b)(1), unless the authorization is terminated or revoked sooner.  Performed at [facility], [place] [address], [place], [place] [address]   Blood culture (routine x 2)     Status: None (Preliminary result)   Collection Time: 04/01/20  6:28 PM   Specimen: BLOOD  Result Value Ref Range Status   Specimen Description BLOOD BLOOD RIGHT FOREARM  Final   Special Requests   Final    BOTTLES DRAWN AEROBIC AND ANAEROBIC Blood Culture results may not be optimal due to an inadequate volume of blood received in culture bottles   Culture   Final    NO GROWTH 2 DAYS Performed at [facility], [place] [address] [place], [place] [address]    Report Status PENDING  Incomplete  Blood culture (routine x 2)     Status: None (Preliminary result)   Collection Time: 04/01/20  6:36 PM   Specimen: BLOOD RIGHT HAND  Result Value Ref Range Status   Specimen Description BLOOD RIGHT HAND  Final   Special Requests   Final    BOTTLES DRAWN AEROBIC AND ANAEROBIC Blood Culture adequate volume   Culture   Final    NO GROWTH 2 DAYS Performed at [facility], [place] [address] [place], [place] [address]    Report Status PENDING  Incomplete  MRSA PCR Screening     Status: None   Collection Time: 04/01/20 11:50 PM   Specimen: Nasal Mucosa; Nasopharyngeal  Result Value Ref Range Status   MRSA by PCR NEGATIVE NEGATIVE Final    Comment:        The GeneXpert MRSA Assay (FDA approved for NASAL specimens only), is one component of a comprehensive MRSA colonization surveillance program. It is not intended to diagnose MRSA infection nor to guide or monitor treatment for MRSA infections. Performed at [facility], [place] [address] [place], [place] [address]      Radiology Studies: [place] LIMITED JOINT SPACE STRUCTURES LOW RIGHT  Result Date: 04/03/2020 CLINICAL DATA:  Right knee swelling. EXAM:  ULTRASOUND RIGHT LOWER EXTREMITY LIMITED TECHNIQUE: Ultrasound examination of the lower extremity soft tissues was performed in the area of clinical concern (right knee). COMPARISON:  None. FINDINGS: There is a tubular fluid collection posterolaterally which measures 5.7 x 1.2 x 3.4 cm. Central echogenic component may reflect fat or tendon.  A small amount of fluid was also noted medially. IMPRESSION: Nonspecific predominately lateral periarticular fluid collection, likely tenosynovitis or a synovial cyst. Clinical follow-up recommended. This could be further evaluated with MRI if clinically warranted. Electronically Signed   By: Richardean Sale M.D.   On: 04/03/2020 11:48   DG Foot Complete Right  Result Date: 04/01/2020 CLINICAL DATA:  Ulcer with increased pain and swelling. EXAM: RIGHT FOOT COMPLETE - 3+ VIEW COMPARISON:  None. FINDINGS: Soft tissue ulcer subjacent to the first digit interphalangeal joint. No radiopaque foreign body. No evidence of associated osteomyelitis. No erosion or bony destruction. No fracture. Mild osteoarthritis in the midfoot and first metatarsal phalangeal joint. Moderate plantar calcaneal spur. No Achilles tendon enthesophyte. Dorsal soft tissue edema overlies the foot. IMPRESSION: 1. Soft tissue ulcer subjacent to the first digit interphalangeal joint. No evidence of osteomyelitis. No radiopaque foreign body. 2. Dorsal soft tissue edema. Electronically Signed   By: Keith Rake M.D.   On: 04/01/2020 18:23   VAS [place] ABI WITH/WO TBI  Result Date: 04/02/2020 LOWER EXTREMITY DOPPLER STUDY Indications: Cellulitis & ulcer of RT great toe. High Risk Factors: Hypertension.  Comparison Study: No previous exams Performing Technologist: Rogelia Rohrer  Examination Guidelines: A complete evaluation includes at minimum, Doppler waveform signals and systolic blood pressure reading at the level of bilateral brachial, anterior tibial, and posterior tibial arteries, when vessel segments are  accessible. Bilateral testing is considered an integral part of a complete examination. Photoelectric Plethysmograph (PPG) waveforms and toe systolic pressure readings are included as required and additional duplex testing as needed. Limited examinations for reoccurring indications may be performed as noted.  ABI Findings: +--------+------------------+-----+---------+--------+ Right   Rt Pressure (mmHg)IndexWaveform Comment  +--------+------------------+-----+---------+--------+ [id number]                    triphasic         +--------+------------------+-----+---------+--------+ PTA     168               1.14 triphasic         +--------+------------------+-----+---------+--------+ DP      156               1.06 biphasic          +--------+------------------+-----+---------+--------+ +--------+------------------+-----+---------+-------+ Left    Lt Pressure (mmHg)IndexWaveform Comment +--------+------------------+-----+---------+-------+ [id number]                    triphasic        +--------+------------------+-----+---------+-------+ PTA     171               1.16 triphasic        +--------+------------------+-----+---------+-------+ DP      178               1.21 biphasic         +--------+------------------+-----+---------+-------+  Summary: Right: Resting right ankle-brachial index is within normal range. No evidence of significant right lower extremity arterial disease. Left: Resting left ankle-brachial index is within normal range. No evidence of significant left lower extremity arterial disease.  *See table(s) above for measurements and observations.  Electronically signed by Monica Martinez MD on 04/02/2020 at 1:51:19 PM.   Final    ECHOCARDIOGRAM COMPLETE  Result Date: 04/02/2020    ECHOCARDIOGRAM REPORT   Patient Name:   NEIZAN DEBRUHL Date of Exam: 04/02/2020 Medical Rec #:  [id number]       Height:       71.0 in Accession #:  [id number]      Weight:        208.0 lb Date of Birth:  1945/01/14       BSA:          2.144 m Patient Age:    25 years        BP:           123/66 mmHg Patient Gender: M               HR:           61 bpm. Exam Location:  Inpatient Procedure: 2D Echo Indications:    murmur  History:        Patient has prior history of Echocardiogram examinations, most                 recent 01/06/2013. Risk Factors:Hypertension and Sleep Apnea.  Sonographer:    Johny Chess Referring Phys: [id number]  Sonographer Comments: Suboptimal subcostal window. IMPRESSIONS  1. Mild aortic stenosis.  2. Left ventricular ejection fraction, by estimation, is 60 to 65%. The left ventricle has normal function. The left ventricle has no regional wall motion abnormalities. There is mild left ventricular hypertrophy. Left ventricular diastolic parameters are consistent with Grade I diastolic dysfunction (impaired relaxation).  3. Right ventricular systolic function is normal. The right ventricular size is normal.  4. The mitral valve is normal in structure. Trivial mitral valve regurgitation. No evidence of mitral stenosis.  5. The aortic valve is tricuspid. There is moderate calcification of the aortic valve. There is moderate thickening of the aortic valve. Aortic valve regurgitation is not visualized. Mild aortic valve stenosis. Aortic valve area, by VTI measures 1.83 cm. Aortic valve mean gradient measures 9.0 mmHg. Aortic valve Vmax measures 2.28 m/s.  6. The inferior vena cava is normal in size with greater than 50% respiratory variability, suggesting right atrial pressure of 3 mmHg. FINDINGS  Left Ventricle: Left ventricular ejection fraction, by estimation, is 60 to 65%. The left ventricle has normal function. The left ventricle has no regional wall motion abnormalities. The left ventricular internal cavity size was normal in size. There is  mild left ventricular hypertrophy. Left ventricular diastolic parameters are consistent with Grade I diastolic  dysfunction (impaired relaxation). Right Ventricle: The right ventricular size is normal. No increase in right ventricular wall thickness. Right ventricular systolic function is normal. Left Atrium: Left atrial size was normal in size. Right Atrium: Right atrial size was normal in size. Pericardium: There is no evidence of pericardial effusion. Mitral Valve: The mitral valve is normal in structure. Trivial mitral valve regurgitation. No evidence of mitral valve stenosis. Tricuspid Valve: The tricuspid valve is normal in structure. Tricuspid valve regurgitation is trivial. No evidence of tricuspid stenosis. Aortic Valve: The aortic valve is tricuspid. There is moderate calcification of the aortic valve. There is moderate thickening of the aortic valve. Aortic valve regurgitation is not visualized. Mild aortic stenosis is present. Aortic valve mean gradient measures 9.0 mmHg. Aortic valve peak gradient measures 20.8 mmHg. Aortic valve area, by VTI measures 1.83 cm. Pulmonic Valve: The pulmonic valve was normal in structure. Pulmonic valve regurgitation is not visualized. No evidence of pulmonic stenosis. Aorta: The aortic root is normal in size and structure. Venous: The inferior vena cava is normal in size with greater than 50% respiratory variability, suggesting right atrial pressure of 3 mmHg. IAS/Shunts: There is right bowing of the interatrial septum, suggestive of elevated left atrial pressure. No atrial level shunt detected  by color flow Doppler.  LEFT VENTRICLE PLAX 2D LVIDd:         5.00 cm  Diastology LVIDs:         3.00 cm  LV e' medial:    9.46 cm/s LV PW:         1.20 cm  LV E/e' medial:  10.2 LV IVS:        1.00 cm  LV e' lateral:   10.80 cm/s LVOT diam:     2.10 cm  LV E/e' lateral: 8.9 LV SV:         76 LV SV Index:   35 LVOT Area:     3.46 cm  RIGHT VENTRICLE RV S prime:     15.80 cm/s TAPSE (M-mode): 2.9 cm LEFT ATRIUM             Index       RIGHT ATRIUM           Index LA diam:        4.70 cm  2.19 cm/m  RA Area:     17.30 cm LA Vol (A2C):   92.3 ml 43.05 ml/m RA Volume:   44.30 ml  20.66 ml/m LA Vol (A4C):   59.4 ml 27.71 ml/m LA Biplane Vol: 77.2 ml 36.01 ml/m  AORTIC VALVE AV Area (Vmax):    1.51 cm AV Area (Vmean):   1.65 cm AV Area (VTI):     1.83 cm AV Vmax:           228.00 cm/s AV Vmean:          141.000 cm/s AV VTI:            0.412 m AV Peak Grad:      20.8 mmHg AV Mean Grad:      9.0 mmHg LVOT Vmax:         99.40 cm/s LVOT Vmean:        67.150 cm/s LVOT VTI:          0.218 m LVOT/AV VTI ratio: 0.53  AORTA Ao Root diam: 3.20 cm Ao Asc diam:  3.40 cm MITRAL VALVE MV Area (PHT): 3.42 cm     SHUNTS MV Decel Time: 222 msec     Systemic VTI:  0.22 m MV E velocity: 96.10 cm/s   Systemic Diam: 2.10 cm MV A velocity: 111.00 cm/s MV E/A ratio:  0.87 Candee Furbish MD Electronically signed by Candee Furbish MD Signature Date/Time: 04/02/2020/2:22:00 PM    Final    VAS [place] LOWER EXTREMITY VENOUS (DVT) (ONLY [facility] & [facility] 7a-7p)  Result Date: 04/02/2020  Lower Venous DVT Study Indications: Edema.  Comparison Study: no prior Performing Technologist: Abram Sander RVS  Examination Guidelines: A complete evaluation includes B-mode imaging, spectral Doppler, color Doppler, and power Doppler as needed of all accessible portions of each vessel. Bilateral testing is considered an integral part of a complete examination. Limited examinations for reoccurring indications may be performed as noted. The reflux portion of the exam is performed with the patient in reverse Trendelenburg.  +---------+---------------+---------+-----------+----------+-------------------+ RIGHT    CompressibilityPhasicitySpontaneityPropertiesThrombus Aging      +---------+---------------+---------+-----------+----------+-------------------+ CFV      Full           Yes      Yes                                      +---------+---------------+---------+-----------+----------+-------------------+  SFJ      Full                                                              +---------+---------------+---------+-----------+----------+-------------------+ FV Prox  Full                                                             +---------+---------------+---------+-----------+----------+-------------------+ FV Mid   Full                                                             +---------+---------------+---------+-----------+----------+-------------------+ FV DistalFull                                                             +---------+---------------+---------+-----------+----------+-------------------+ PFV      Full                                                             +---------+---------------+---------+-----------+----------+-------------------+ POP      Full           Yes      Yes                                      +---------+---------------+---------+-----------+----------+-------------------+ PTV      Full                                                             +---------+---------------+---------+-----------+----------+-------------------+ PERO                                                  Not well visualized +---------+---------------+---------+-----------+----------+-------------------+   +---------+---------------+---------+-----------+----------+-------------------+ LEFT     CompressibilityPhasicitySpontaneityPropertiesThrombus Aging      +---------+---------------+---------+-----------+----------+-------------------+ CFV      Full           Yes      Yes                                      +---------+---------------+---------+-----------+----------+-------------------+ SFJ      Full                                                             +---------+---------------+---------+-----------+----------+-------------------+  FV Prox  Full                                                              +---------+---------------+---------+-----------+----------+-------------------+ FV Mid   Full                                                             +---------+---------------+---------+-----------+----------+-------------------+ FV DistalFull                                                             +---------+---------------+---------+-----------+----------+-------------------+ PFV      Full                                                             +---------+---------------+---------+-----------+----------+-------------------+ POP      Full           Yes      Yes                                      +---------+---------------+---------+-----------+----------+-------------------+ PTV      Full                                                             +---------+---------------+---------+-----------+----------+-------------------+ PERO                                                  Not well visualized +---------+---------------+---------+-----------+----------+-------------------+     Summary: BILATERAL: - No evidence of deep vein thrombosis seen in the lower extremities, bilaterally. - No evidence of superficial venous thrombosis in the lower extremities, bilaterally. -No evidence of popliteal cyst, bilaterally.   *See table(s) above for measurements and observations. Electronically signed by Monica Martinez MD on 04/02/2020 at 1:50:42 PM.    Final    [place] LIMITED JOINT SPACE STRUCTURES LOW RIGHT  Final Result    VAS [place] ABI WITH/WO TBI  Final Result    DG Foot Complete Right  Final Result    VAS [place] LOWER EXTREMITY VENOUS (DVT) (ONLY [facility] & [facility] 7a-7p)  Final Result      Scheduled Meds: . amLODipine  2.5 mg Oral Daily  . docusate sodium  100 mg Oral BID  . irbesartan  300 mg Oral Daily  . pantoprazole  40 mg Oral Daily  .  senna  1 tablet Oral BID   PRN Meds: acetaminophen **OR** acetaminophen, HYDROcodone-acetaminophen, morphine  injection Continuous Infusions: . cefTRIAXone (ROCEPHIN)  IV 2 g (04/02/20 2233)  . vancomycin 1,250 mg (04/03/20 0622)     LOS: 1 day  Time spent: Greater than 50% of the 35 minute visit was spent in counseling/coordination of care for the patient as laid out in the A&P.   Dwyane Dee, MD [facility] 04/03/2020, 1:25 PM

## 2020-04-03 NOTE — Discharge Summary (Signed)
Physician Discharge Summary   Alexander Cooper JTT:[id number] DOB: 09-Jan-1945 DOA: 04/01/2020  PCP: Alexander Orn, MD  Admit date: 04/01/2020 Discharge date:    Admitted From: Home Disposition: Home Discharging physician: Dwyane Dee, MD  Recommendations for Outpatient Follow-up:  1. Follow-up with podiatry in 1 week 2. Follow-up with orthopedic surgery if no improvement in knee or worsening swelling  Patient discharged to home in Discharge Condition: stable Risk of unplanned readmission score: Unplanned Admission- [place] do not use: 8.47  CODE STATUS: Full Diet recommendation:  Diet Orders (From admission, onward)    Start     Ordered   04/03/20 0000  Diet - low sodium heart healthy        04/03/20 1543   04/01/20 1919  Diet Heart Room service appropriate? Yes; Fluid consistency: Thin  Diet effective now       Question Answer Comment  Room service appropriate? Yes   Fluid consistency: Thin      04/01/20 1918          Hospital Course: [name] is a 75 yo male with PMH hypertension, sleep apnea, GERD who presented to the hospital with worsening redness, pain, swelling in his right leg.  He has not been on any antibiotics prior to admission and was referred to the ER due to worsening appearance of his leg. He had denied any injury that he knew of to his foot and denied any prior history of similar episode. He underwent further evaluation in the ER and his presentation appeared consistent with right lower extremity cellulitis.  He was started on vancomycin and Rocephin and admitted for further work-up. Podiatry was consulted regarding regarding a wound noted to the plantar aspect of right great toe; he underwent bedside debridement with podiatry.  Other work-up consisted of ABIs bilaterally as well as bilateral lower extremity duplex.  There was no significant vascular disease nor evidence of DVT bilaterally. He also then developed a erythematous nodule over his right knee and  underwent ultrasound on 04/03/2020.  This revealed a periarticular fluid collection and due to this ortho surgery was then consulted.  Aspiration was attempted however there was no fluid able to be obtained.  He was placed in a compression wrap around his knee and recommended to continue supportive care and follow-up if any further swelling or worsening.  He will continue antibiotic ointment application daily to his right foot incision and follow-up outpatient with podiatry as well.  He was discharged with doxycycline to complete a course.  Clinically he had shown good improvement prior to discharge while on IV antibiotics during hospitalization.   * Cellulitis -After further evaluation, patient does have injury to the plantar aspect of his right large toe with mild purulent drainage noted on the interdigital aspect.  He has associated erythema and edema of the right lower extremity likely originating from his foot/injury.  He is active and on his feet most all day (works at [employer] and also walks large distances almost daily) - appearance is not well demarcated, favor a Staph infection; treated with vancomycin and Rocephin during hospitalization - ABI normal and LE duplex negative for DVT bilaterally - s/p bedside I&D with podiatry on 04/02/20 to ulceration on plantar surface right great toe - now has developed right lower knee erythematous and tender lesion; U/S states periarticular fluid (5.7 x 1.2 x 3.4 cm); possibly bursitis; orthopedic surgery was consulted and aspiration attempt was unsuccessful for obtaining fluid.  He was recommended to continue with knee  compression wrap and outpatient follow-up if still needed if no improvement -Patient will also follow-up with podiatry in approximately 1 week -Discharged on doxycycline to complete course  Foot ulcer ([place]) - see cellulitis   Hypokalemia -Replete and recheck as needed  Mild aortic stenosis -Patient has known murmur.  Echo  ordered on admission and shows mild aortic stenosis  GERD (gastroesophageal reflux disease) -Continue Protonix  OSA (obstructive sleep apnea) -Followed closely outpatient   Essential hypertension -Continue amlodipine and irbesartan    The patient's chronic medical conditions were treated accordingly per the patient's home medication regimen except as noted.  On day of discharge, patient was felt deemed stable for discharge. Patient/family member advised to call PCP or come back to ER if needed.   Principal Diagnosis: Cellulitis  Discharge Diagnoses: Active Hospital Problems   Diagnosis Date Noted  . Cellulitis 04/01/2020    Priority: High  . Foot ulcer ([place]) 04/01/2020    Priority: High  . Mild aortic stenosis 04/02/2020  . Hypokalemia 04/02/2020  . Essential hypertension 04/01/2020  . OSA (obstructive sleep apnea) 04/01/2020  . GERD (gastroesophageal reflux disease) 04/01/2020    Resolved Hospital Problems  No resolved problems to display.    Discharge Instructions    Diet - low sodium heart healthy   Complete by: As directed    Increase activity slowly   Complete by: As directed      Allergies as of 04/03/2020      Reactions   Codeine Nausea And Vomiting   Chocolate Flavor Rash      Medication List    TAKE these medications   amLODipine 2.5 MG tablet Commonly known as: NORVASC Take 2.5 mg by mouth daily.   chlorthalidone 25 MG tablet Commonly known as: HYGROTON Take 25 mg by mouth daily.   doxycycline 100 MG tablet Commonly known as: VIBRA-TABS Take 1 tablet (100 mg total) by mouth every 12 (twelve) hours for 7 days.   GRAPE SEED CR PO Take 2 tablets by mouth daily.   omeprazole 40 MG capsule Commonly known as: PRILOSEC Take 40 mg by mouth 2 (two) times daily.   potassium chloride SA 20 MEQ tablet Commonly known as: KLOR-CON Take 20 mEq by mouth 2 (two) times daily.   psyllium 95 % Pack Commonly known as: HYDROCIL/METAMUCIL Take 1 packet by  mouth daily.   telmisartan 40 MG tablet Commonly known as: MICARDIS Take 40 mg by mouth in the morning and at bedtime.   traMADol 50 MG tablet Commonly known as: ULTRAM Take 50 mg by mouth every 12 (twelve) hours as needed for moderate pain.   triamcinolone 0.1 % Commonly known as: KENALOG Apply 1 application topically 2 (two) times daily as needed (for rash).   vitamin B-12 1000 MCG tablet Commonly known as: CYANOCOBALAMIN Take 1,000 mcg by mouth daily.       Follow-up Information    Edrick Kins, DPM. Schedule an appointment as soon as possible for a visit in 1 week(s).   Specialty: Podiatry Contact information: [address] [place] [address] [phone]        Erle Crocker, MD Follow up.   Specialty: Orthopedic Surgery Why: Call if needed for any worsening of knee Contact information: [place] [address] [phone]              Allergies  Allergen Reactions  . Codeine Nausea And Vomiting  . Chocolate Flavor Rash    Consultations: Podiatry Orthopedic  surgery  Discharge Exam: BP (!) 153/74 (BP Location: Left Arm)   Pulse 65   Temp 98.3 F (36.8 C)   Resp 18   SpO2 96%  General appearance: alert, cooperative and no distress Head: Normocephalic, without obvious abnormality, atraumatic Eyes: EOMI Lungs: clear to auscultation bilaterally Heart: regular rate and rhythm, S1, S2 normal and 3/6 HSM heard throughout precordium Abdomen: normal findings: bowel sounds normal and soft, non-tender Extremities: Plantar aspect of right great toe now s/p debridement; erythema of right lower extremity also significantly improving.   erythematous and tender lesion noted on the lower aspect of right knee Skin: Erythema scattered with improvement noted in right lower extremity.  No clear demarcations Neurologic: Grossly normal  The results of significant diagnostics from this hospitalization (including imaging,  microbiology, ancillary and laboratory) are listed below for reference.   Microbiology: Recent Results (from the past 240 hour(s))  SARS CORONAVIRUS 2 (TAT 6-24 HRS) Nasopharyngeal Nasopharyngeal Swab     Status: None   Collection Time: 04/01/20  6:00 PM   Specimen: Nasopharyngeal Swab  Result Value Ref Range Status   SARS Coronavirus 2 NEGATIVE NEGATIVE Final    Comment: (NOTE) SARS-CoV-2 target nucleic acids are NOT DETECTED.  The SARS-CoV-2 RNA is generally detectable in upper and lower respiratory specimens during the acute phase of infection. Negative results do not preclude SARS-CoV-2 infection, do not rule out co-infections with other pathogens, and should not be used as the sole basis for treatment or other patient management decisions. Negative results must be combined with clinical observations, patient history, and epidemiological information. The expected result is Negative.  Fact Sheet for Patients: SugarRoll.be  Fact Sheet for Healthcare Providers: https://www.woods-mathews.com/  This test is not yet approved or cleared by the [place] FDA and  has been authorized for detection and/or diagnosis of SARS-CoV-2 by FDA under an Emergency Use Authorization (EUA). This EUA will remain  in effect (meaning this test can be used) for the duration of the COVID-19 declaration under Se ction 564(b)(1) of the Act, 21 U.S.C. section 360bbb-3(b)(1), unless the authorization is terminated or revoked sooner.  Performed at [facility], [place] [address] [place], [place] [address]   Blood culture (routine x 2)     Status: None (Preliminary result)   Collection Time: 04/01/20  6:28 PM   Specimen: BLOOD  Result Value Ref Range Status   Specimen Description BLOOD BLOOD RIGHT FOREARM  Final   Special Requests   Final    BOTTLES DRAWN AEROBIC AND ANAEROBIC Blood Culture results may not be optimal due to an inadequate volume of blood  received in culture bottles   Culture   Final    NO GROWTH 2 DAYS Performed at [facility], [place] [address], [place], [place] [address]    Report Status PENDING  Incomplete  Blood culture (routine x 2)     Status: None (Preliminary result)   Collection Time: 04/01/20  6:36 PM   Specimen: BLOOD RIGHT HAND  Result Value Ref Range Status   Specimen Description BLOOD RIGHT HAND  Final   Special Requests   Final    BOTTLES DRAWN AEROBIC AND ANAEROBIC Blood Culture adequate volume   Culture   Final    NO GROWTH 2 DAYS Performed at [facility], [place] [address] [place], [place] [address]    Report Status PENDING  Incomplete  MRSA PCR Screening     Status: None   Collection Time: 04/01/20 11:50 PM  Specimen: Nasal Mucosa; Nasopharyngeal  Result Value Ref Range Status   MRSA by PCR NEGATIVE NEGATIVE Final    Comment:        The GeneXpert MRSA Assay (FDA approved for NASAL specimens only), is one component of a comprehensive MRSA colonization surveillance program. It is not intended to diagnose MRSA infection nor to guide or monitor treatment for MRSA infections. Performed at [facility], [place] [address] [place], [place] [address]      Labs: BNP (last 3 results) No results for input(s): BNP in the last 8760 hours. Basic Metabolic Panel: Recent Labs  Lab 04/01/20 1527 04/02/20 0045 04/03/20 0349  NA 138 138 138  K 3.7 3.3* 4.2  CL 104 107 107  CO2 27 26 26   GLUCOSE 84 156* 105*  BUN 13 12 11   CREATININE 0.93 0.90 0.94  CALCIUM 8.9 8.3* 8.4*  MG  --  2.2 2.2  PHOS  --  2.8  --    Liver Function Tests: Recent Labs  Lab 04/02/20 0045  AST 17  17  ALT 13  12  ALKPHOS 55  54  BILITOT 0.5  0.6  PROT 5.8*  5.8*  ALBUMIN 2.8*  2.9*   No results for input(s): LIPASE, AMYLASE in the last 168 hours. No results for input(s): AMMONIA in the last 168 hours. CBC: Recent Labs  Lab 04/01/20 1527 04/01/20 1831 04/02/20 0045 04/03/20 0349  WBC  7.5 7.7 6.7 7.4  NEUTROABS  --  5.4 4.5 4.7  HGB 13.5 14.0 12.5* 11.9*  HCT 41.1 42.2 36.8* 37.0*  MCV 90.9 91.3 89.5 95.4  PLT 235 223 208 222   Cardiac Enzymes: No results for input(s): CKTOTAL, CKMB, CKMBINDEX, TROPONINI in the last 168 hours. BNP: Invalid input(s): POCBNP CBG: No results for input(s): GLUCAP in the last 168 hours. D-Dimer No results for input(s): DDIMER in the last 72 hours. Hgb A1c Recent Labs    04/01/20 1851  HGBA1C 5.2   Lipid Profile No results for input(s): CHOL, HDL, LDLCALC, TRIG, CHOLHDL, LDLDIRECT in the last 72 hours. Thyroid function studies Recent Labs    04/02/20 0045  TSH 0.872   Anemia work up No results for input(s): VITAMINB12, FOLATE, FERRITIN, TIBC, IRON, RETICCTPCT in the last 72 hours. Urinalysis No results found for: COLORURINE, APPEARANCEUR, [place], [place], GLUCOSEU, [place], [place], [place], PROTEINUR, UROBILINOGEN, NITRITE, LEUKOCYTESUR Sepsis Labs Invalid input(s): PROCALCITONIN,  WBC,  LACTICIDVEN Microbiology Recent Results (from the past 240 hour(s))  SARS CORONAVIRUS 2 (TAT 6-24 HRS) Nasopharyngeal Nasopharyngeal Swab     Status: None   Collection Time: 04/01/20  6:00 PM   Specimen: Nasopharyngeal Swab  Result Value Ref Range Status   SARS Coronavirus 2 NEGATIVE NEGATIVE Final    Comment: (NOTE) SARS-CoV-2 target nucleic acids are NOT DETECTED.  The SARS-CoV-2 RNA is generally detectable in upper and lower respiratory specimens during the acute phase of infection. Negative results do not preclude SARS-CoV-2 infection, do not rule out co-infections with other pathogens, and should not be used as the sole basis for treatment or other patient management decisions. Negative results must be combined with clinical observations, patient history, and epidemiological information. The expected result is Negative.  Fact Sheet for Patients: SugarRoll.be  Fact Sheet for Healthcare  Providers: https://www.woods-mathews.com/  This test is not yet approved or cleared by the [place] FDA and  has been authorized for detection and/or diagnosis of SARS-CoV-2 by FDA under an Emergency Use Authorization (EUA). This EUA will remain  in effect (meaning this test can be used) for the duration of the COVID-19 declaration under Se ction 564(b)(1) of the Act, 21 U.S.C. section 360bbb-3(b)(1), unless the authorization is terminated or revoked sooner.  Performed at [facility], [place] [address], [place], [place] [address]   Blood culture (routine x 2)     Status: None (Preliminary result)   Collection Time: 04/01/20  6:28 PM   Specimen: BLOOD  Result Value Ref Range Status   Specimen Description BLOOD BLOOD RIGHT FOREARM  Final   Special Requests   Final    BOTTLES DRAWN AEROBIC AND ANAEROBIC Blood Culture results may not be optimal due to an inadequate volume of blood received in culture bottles   Culture   Final    NO GROWTH 2 DAYS Performed at [facility], [place] [address], [place], [place] [address]    Report Status PENDING  Incomplete  Blood culture (routine x 2)     Status: None (Preliminary result)   Collection Time: 04/01/20  6:36 PM   Specimen: BLOOD RIGHT HAND  Result Value Ref Range Status   Specimen Description BLOOD RIGHT HAND  Final   Special Requests   Final    BOTTLES DRAWN AEROBIC AND ANAEROBIC Blood Culture adequate volume   Culture   Final    NO GROWTH 2 DAYS Performed at [facility], [place] [address], [place], [place] [address]    Report Status PENDING  Incomplete  MRSA PCR Screening     Status: None   Collection Time: 04/01/20 11:50 PM   Specimen: Nasal Mucosa; Nasopharyngeal  Result Value Ref Range Status   MRSA by PCR NEGATIVE NEGATIVE Final    Comment:        The GeneXpert MRSA Assay (FDA approved for NASAL specimens only), is one component of a comprehensive MRSA colonization surveillance program. It is  not intended to diagnose MRSA infection nor to guide or monitor treatment for MRSA infections. Performed at [facility], [place] [address], [place],  [address]     Procedures/Studies: [place] LIMITED JOINT SPACE STRUCTURES LOW RIGHT  Result Date: 04/03/2020 CLINICAL DATA:  Right knee swelling. EXAM: ULTRASOUND RIGHT LOWER EXTREMITY LIMITED TECHNIQUE: Ultrasound examination of the lower extremity soft tissues was performed in the area of clinical concern (right knee). COMPARISON:  None. FINDINGS: There is a tubular fluid collection posterolaterally which measures 5.7 x 1.2 x 3.4 cm. Central echogenic component may reflect fat or tendon. A small amount of fluid was also noted medially. IMPRESSION: Nonspecific predominately lateral periarticular fluid collection, likely tenosynovitis or a synovial cyst. Clinical follow-up recommended. This could be further evaluated with MRI if clinically warranted. Electronically Signed   By: Richardean Sale M.D.   On: 04/03/2020 11:48   DG Foot Complete Right  Result Date: 04/01/2020 CLINICAL DATA:  Ulcer with increased pain and swelling. EXAM: RIGHT FOOT COMPLETE - 3+ VIEW COMPARISON:  None. FINDINGS: Soft tissue ulcer subjacent to the first digit interphalangeal joint. No radiopaque foreign body. No evidence of associated osteomyelitis. No erosion or bony destruction. No fracture. Mild osteoarthritis in the midfoot and first metatarsal phalangeal joint. Moderate plantar calcaneal spur. No Achilles tendon enthesophyte. Dorsal soft tissue edema overlies the foot. IMPRESSION: 1. Soft tissue ulcer subjacent to the first digit interphalangeal joint. No evidence of osteomyelitis. No radiopaque foreign body. 2. Dorsal soft tissue edema. Electronically Signed   By: Keith Rake M.D.   On: 04/01/2020 18:23   VAS [place]  ABI WITH/WO TBI  Result Date: 04/02/2020 LOWER EXTREMITY DOPPLER STUDY Indications: Cellulitis & ulcer of RT great toe. High Risk Factors:  Hypertension.  Comparison Study: No previous exams Performing Technologist: Rogelia Rohrer  Examination Guidelines: A complete evaluation includes at minimum, Doppler waveform signals and systolic blood pressure reading at the level of bilateral brachial, anterior tibial, and posterior tibial arteries, when vessel segments are accessible. Bilateral testing is considered an integral part of a complete examination. Photoelectric Plethysmograph (PPG) waveforms and toe systolic pressure readings are included as required and additional duplex testing as needed. Limited examinations for reoccurring indications may be performed as noted.  ABI Findings: +--------+------------------+-----+---------+--------+ Right   Rt Pressure (mmHg)IndexWaveform Comment  +--------+------------------+-----+---------+--------+ [id number]                    triphasic         +--------+------------------+-----+---------+--------+ PTA     168               1.14 triphasic         +--------+------------------+-----+---------+--------+ DP      156               1.06 biphasic          +--------+------------------+-----+---------+--------+ +--------+------------------+-----+---------+-------+ Left    Lt Pressure (mmHg)IndexWaveform Comment +--------+------------------+-----+---------+-------+ [id number]                    triphasic        +--------+------------------+-----+---------+-------+ PTA     171               1.16 triphasic        +--------+------------------+-----+---------+-------+ DP      178               1.21 biphasic         +--------+------------------+-----+---------+-------+  Summary: Right: Resting right ankle-brachial index is within normal range. No evidence of significant right lower extremity arterial disease. Left: Resting left ankle-brachial index is within normal range. No evidence of significant left lower extremity arterial disease.  *See table(s) above for measurements and  observations.  Electronically signed by Monica Martinez MD on 04/02/2020 at 1:51:19 PM.   Final    ECHOCARDIOGRAM COMPLETE  Result Date: 04/02/2020    ECHOCARDIOGRAM REPORT   Patient Name:   Alexander Cooper Date of Exam: 04/02/2020 Medical Rec #:  [id number]       Height:       71.0 in Accession #:    [id number]      Weight:       208.0 lb Date of Birth:  Jun 30, 1945       BSA:          2.144 m Patient Age:    12 years        BP:           123/66 mmHg Patient Gender: M               HR:           61 bpm. Exam Location:  Inpatient Procedure: 2D Echo Indications:    murmur  History:        Patient has prior history of Echocardiogram examinations, most                 recent 01/06/2013. Risk Factors:Hypertension and Sleep Apnea.  Sonographer:    Johny Chess Referring Phys: [id number] [place]  Sonographer Comments: Suboptimal subcostal window. IMPRESSIONS  1. Mild aortic  stenosis.  2. Left ventricular ejection fraction, by estimation, is 60 to 65%. The left ventricle has normal function. The left ventricle has no regional wall motion abnormalities. There is mild left ventricular hypertrophy. Left ventricular diastolic parameters are consistent with Grade I diastolic dysfunction (impaired relaxation).  3. Right ventricular systolic function is normal. The right ventricular size is normal.  4. The mitral valve is normal in structure. Trivial mitral valve regurgitation. No evidence of mitral stenosis.  5. The aortic valve is tricuspid. There is moderate calcification of the aortic valve. There is moderate thickening of the aortic valve. Aortic valve regurgitation is not visualized. Mild aortic valve stenosis. Aortic valve area, by VTI measures 1.83 cm. Aortic valve mean gradient measures 9.0 mmHg. Aortic valve Vmax measures 2.28 m/s.  6. The inferior vena cava is normal in size with greater than 50% respiratory variability, suggesting right atrial pressure of 3 mmHg. FINDINGS  Left Ventricle: Left ventricular  ejection fraction, by estimation, is 60 to 65%. The left ventricle has normal function. The left ventricle has no regional wall motion abnormalities. The left ventricular internal cavity size was normal in size. There is  mild left ventricular hypertrophy. Left ventricular diastolic parameters are consistent with Grade I diastolic dysfunction (impaired relaxation). Right Ventricle: The right ventricular size is normal. No increase in right ventricular wall thickness. Right ventricular systolic function is normal. Left Atrium: Left atrial size was normal in size. Right Atrium: Right atrial size was normal in size. Pericardium: There is no evidence of pericardial effusion. Mitral Valve: The mitral valve is normal in structure. Trivial mitral valve regurgitation. No evidence of mitral valve stenosis. Tricuspid Valve: The tricuspid valve is normal in structure. Tricuspid valve regurgitation is trivial. No evidence of tricuspid stenosis. Aortic Valve: The aortic valve is tricuspid. There is moderate calcification of the aortic valve. There is moderate thickening of the aortic valve. Aortic valve regurgitation is not visualized. Mild aortic stenosis is present. Aortic valve mean gradient measures 9.0 mmHg. Aortic valve peak gradient measures 20.8 mmHg. Aortic valve area, by VTI measures 1.83 cm. Pulmonic Valve: The pulmonic valve was normal in structure. Pulmonic valve regurgitation is not visualized. No evidence of pulmonic stenosis. Aorta: The aortic root is normal in size and structure. Venous: The inferior vena cava is normal in size with greater than 50% respiratory variability, suggesting right atrial pressure of 3 mmHg. IAS/Shunts: There is right bowing of the interatrial septum, suggestive of elevated left atrial pressure. No atrial level shunt detected by color flow Doppler.  LEFT VENTRICLE PLAX 2D LVIDd:         5.00 cm  Diastology LVIDs:         3.00 cm  LV e' medial:    9.46 cm/s LV PW:         1.20 cm  LV  E/e' medial:  10.2 LV IVS:        1.00 cm  LV e' lateral:   10.80 cm/s LVOT diam:     2.10 cm  LV E/e' lateral: 8.9 LV SV:         76 LV SV Index:   35 LVOT Area:     3.46 cm  RIGHT VENTRICLE RV S prime:     15.80 cm/s TAPSE (M-mode): 2.9 cm LEFT ATRIUM             Index       RIGHT ATRIUM           Index LA diam:  4.70 cm 2.19 cm/m  RA Area:     17.30 cm LA Vol (A2C):   92.3 ml 43.05 ml/m RA Volume:   44.30 ml  20.66 ml/m LA Vol (A4C):   59.4 ml 27.71 ml/m LA Biplane Vol: 77.2 ml 36.01 ml/m  AORTIC VALVE AV Area (Vmax):    1.51 cm AV Area (Vmean):   1.65 cm AV Area (VTI):     1.83 cm AV Vmax:           228.00 cm/s AV Vmean:          141.000 cm/s AV VTI:            0.412 m AV Peak Grad:      20.8 mmHg AV Mean Grad:      9.0 mmHg LVOT Vmax:         99.40 cm/s LVOT Vmean:        67.150 cm/s LVOT VTI:          0.218 m LVOT/AV VTI ratio: 0.53  AORTA Ao Root diam: 3.20 cm Ao Asc diam:  3.40 cm MITRAL VALVE MV Area (PHT): 3.42 cm     SHUNTS MV Decel Time: 222 msec     Systemic VTI:  0.22 m MV E velocity: 96.10 cm/s   Systemic Diam: 2.10 cm MV A velocity: 111.00 cm/s MV E/A ratio:  0.87 Candee Furbish MD Electronically signed by Candee Furbish MD Signature Date/Time: 04/02/2020/2:22:00 PM    Final    VAS [place] LOWER EXTREMITY VENOUS (DVT) (ONLY [facility] & [facility] 7a-7p)  Result Date: 04/02/2020  Lower Venous DVT Study Indications: Edema.  Comparison Study: no prior Performing Technologist: Abram Sander RVS  Examination Guidelines: A complete evaluation includes B-mode imaging, spectral Doppler, color Doppler, and power Doppler as needed of all accessible portions of each vessel. Bilateral testing is considered an integral part of a complete examination. Limited examinations for reoccurring indications may be performed as noted. The reflux portion of the exam is performed with the patient in reverse Trendelenburg.  +---------+---------------+---------+-----------+----------+-------------------+ RIGHT     CompressibilityPhasicitySpontaneityPropertiesThrombus Aging      +---------+---------------+---------+-----------+----------+-------------------+ CFV      Full           Yes      Yes                                      +---------+---------------+---------+-----------+----------+-------------------+ SFJ      Full                                                             +---------+---------------+---------+-----------+----------+-------------------+ FV Prox  Full                                                             +---------+---------------+---------+-----------+----------+-------------------+ FV Mid   Full                                                             +---------+---------------+---------+-----------+----------+-------------------+  FV DistalFull                                                             +---------+---------------+---------+-----------+----------+-------------------+ PFV      Full                                                             +---------+---------------+---------+-----------+----------+-------------------+ POP      Full           Yes      Yes                                      +---------+---------------+---------+-----------+----------+-------------------+ PTV      Full                                                             +---------+---------------+---------+-----------+----------+-------------------+ PERO                                                  Not well visualized +---------+---------------+---------+-----------+----------+-------------------+   +---------+---------------+---------+-----------+----------+-------------------+ LEFT     CompressibilityPhasicitySpontaneityPropertiesThrombus Aging      +---------+---------------+---------+-----------+----------+-------------------+ CFV      Full           Yes      Yes                                       +---------+---------------+---------+-----------+----------+-------------------+ SFJ      Full                                                             +---------+---------------+---------+-----------+----------+-------------------+ FV Prox  Full                                                             +---------+---------------+---------+-----------+----------+-------------------+ FV Mid   Full                                                             +---------+---------------+---------+-----------+----------+-------------------+ FV DistalFull                                                             +---------+---------------+---------+-----------+----------+-------------------+  PFV      Full                                                             +---------+---------------+---------+-----------+----------+-------------------+ POP      Full           Yes      Yes                                      +---------+---------------+---------+-----------+----------+-------------------+ PTV      Full                                                             +---------+---------------+---------+-----------+----------+-------------------+ PERO                                                  Not well visualized +---------+---------------+---------+-----------+----------+-------------------+     Summary: BILATERAL: - No evidence of deep vein thrombosis seen in the lower extremities, bilaterally. - No evidence of superficial venous thrombosis in the lower extremities, bilaterally. -No evidence of popliteal cyst, bilaterally.   *See table(s) above for measurements and observations. Electronically signed by Monica Martinez MD on 04/02/2020 at 1:50:42 PM.    Final      Time coordinating discharge: Over 72 minutes    Dwyane Dee, MD  [facility] 04/03/2020, 3:56 PM

## 2020-04-03 NOTE — Consult Note (Signed)
Reason for Consult: Swelling and redness around the right knee in the setting of cellulitis Referring Physician: Dr. Nanetta Cooper is an 75 y.o. male.  HPI: Patient presented with increasing swelling and redness in his right leg and ankle.  He states on Monday he was unable to bear weight on his leg due to discomfort.  He was seen by his primary care physician Dr. Laurann Cooper who recommended presenting for admission and treatment of his cellulitis.  He also had a small wound on the plantar aspect of his hallux that underwent I&D with [name], D.P.M.  Patient has had improvement in his swelling and redness over the past several days but this morning developed some redness in the anterior aspect of his knee at the site of his tibial tubercle and ultrasound demonstrated small fluid collection laterally that was extra-articular.  Orthopedics was consulted for evaluation.  Is pleased with his improvement in his leg swelling and redness overall but does note pain in this anterior knee region.  He also notes a rash on the posterior aspect of his knee that has been present for a couple of days.  He states it is itchy.  He denies fevers or chills.  He lives in [place].  Past Medical History:  Diagnosis Date  . GERD (gastroesophageal reflux disease)   . Hypertension   . Sleep apnea    Cpap does not aways use    Past Surgical History:  Procedure Laterality Date  . COLONOSCOPY WITH PROPOFOL N/A 08/17/2014   Procedure: COLONOSCOPY WITH PROPOFOL;  Surgeon: Alexander Fair, MD;  Location: [facility];  Service: Endoscopy;  Laterality: N/A;    Family History  Problem Relation Age of Onset  . CAD Father     Social History:  reports that he has never smoked. He has never used smokeless tobacco. He reports current alcohol use. He reports that he does not use drugs.  Allergies:  Allergies  Allergen Reactions  . Codeine Nausea And Vomiting  . Chocolate Flavor Rash    Medications: I have reviewed  the patient's current medications.  Results for orders placed or performed during the hospital encounter of 04/01/20 (from the past 48 hour(s))  Basic metabolic panel     Status: None   Collection Time: 04/01/20  3:27 PM  Result Value Ref Range   Sodium 138 135 - 145 mmol/L   Potassium 3.7 3.5 - 5.1 mmol/L   Chloride 104 98 - 111 mmol/L   CO2 27 22 - 32 mmol/L   Glucose, Bld 84 70 - 99 mg/dL    Comment: Glucose reference range applies only to samples taken after fasting for at least 8 hours.   BUN 13 8 - 23 mg/dL   Creatinine, Ser 0.93 0.61 - 1.24 mg/dL   Calcium 8.9 8.9 - 10.3 mg/dL   GFR, Estimated >60 >60 mL/min    Comment: (NOTE) Calculated using the CKD-EPI Creatinine Equation (2021)    Anion gap 7 5 - 15    Comment: Performed at [facility] [address] [place] [address]  CBC     Status: None   Collection Time: 04/01/20  3:27 PM  Result Value Ref Range   WBC 7.5 4.0 - 10.5 K/uL   RBC 4.52 4.22 - 5.81 MIL/uL   Hemoglobin 13.5 13.0 - 17.0 g/dL   HCT 41.1 39.0 - 52.0 %   MCV 90.9 80.0 - 100.0 fL   MCH 29.9 26.0 - 34.0 pg  MCHC 32.8 30.0 - 36.0 g/dL   RDW 13.4 11.5 - 15.5 %   Platelets 235 150 - 400 K/uL   nRBC 0.0 0.0 - 0.2 %    Comment: Performed at [facility], [place] [address] [place], [place] [address]  SARS CORONAVIRUS 2 (TAT 6-24 HRS) Nasopharyngeal Nasopharyngeal Swab     Status: None   Collection Time: 04/01/20  6:00 PM   Specimen: Nasopharyngeal Swab  Result Value Ref Range   SARS Coronavirus 2 NEGATIVE NEGATIVE    Comment: (NOTE) SARS-CoV-2 target nucleic acids are NOT DETECTED.  The SARS-CoV-2 RNA is generally detectable in upper and lower respiratory specimens during the acute phase of infection. Negative results do not preclude SARS-CoV-2 infection, do not rule out co-infections with other pathogens, and should not be used as the sole basis for treatment or other patient management decisions. Negative results must be combined  with clinical observations, patient history, and epidemiological information. The expected result is Negative.  Fact Sheet for Patients: SugarRoll.be  Fact Sheet for Healthcare Providers: https://www.woods-mathews.com/  This test is not yet approved or cleared by the [place] FDA and  has been authorized for detection and/or diagnosis of SARS-CoV-2 by FDA under an Emergency Use Authorization (EUA). This EUA will remain  in effect (meaning this test can be used) for the duration of the COVID-19 declaration under Se ction 564(b)(1) of the Act, 21 U.S.C. section 360bbb-3(b)(1), unless the authorization is terminated or revoked sooner.  Performed at [facility], [place] [address] [place], [place] [address]   Blood culture (routine x 2)     Status: None (Preliminary result)   Collection Time: 04/01/20  6:28 PM   Specimen: BLOOD  Result Value Ref Range   Specimen Description BLOOD BLOOD RIGHT FOREARM    Special Requests      BOTTLES DRAWN AEROBIC AND ANAEROBIC Blood Culture results may not be optimal due to an inadequate volume of blood received in culture bottles   Culture      NO GROWTH 2 DAYS Performed at [facility], [place] [address], [place], [place] [address]    Report Status PENDING   Differential     Status: None   Collection Time: 04/01/20  6:31 PM  Result Value Ref Range   Neutrophils Relative % 71 %   Neutro Abs 5.4 1.7 - 7.7 K/uL   Lymphocytes Relative 16 %   Lymphs Abs 1.2 0.7 - 4.0 K/uL   Monocytes Relative 9 %   Monocytes Absolute 0.7 0.1 - 1.0 K/uL   Eosinophils Relative 3 %   Eosinophils Absolute 0.2 0.0 - 0.5 K/uL   Basophils Relative 1 %   Basophils Absolute 0.0 0.0 - 0.1 K/uL   Immature Granulocytes 0 %   Abs Immature Granulocytes 0.02 0.00 - 0.07 K/uL    Comment: Performed at [facility], [place] [address], [place], [place] [address]  CBC     Status: None   Collection Time: 04/01/20  6:31 PM   Result Value Ref Range   WBC 7.7 4.0 - 10.5 K/uL   RBC 4.62 4.22 - 5.81 MIL/uL   Hemoglobin 14.0 13.0 - 17.0 g/dL   HCT 42.2 39.0 - 52.0 %   MCV 91.3 80.0 - 100.0 fL   MCH 30.3 26.0 - 34.0 pg   MCHC 33.2 30.0 - 36.0 g/dL   RDW 13.5 11.5 - 15.5 %   Platelets 223 150 - 400 K/uL   nRBC  0.0 0.0 - 0.2 %    Comment: Performed at [facility], [place] [address], [place], [place] [address]  Lactic acid, plasma     Status: None   Collection Time: 04/01/20  6:35 PM  Result Value Ref Range   Lactic Acid, Venous 0.9 0.5 - 1.9 mmol/L    Comment: Performed at [facility] [address], [place], [place] [address]  Blood culture (routine x 2)     Status: None (Preliminary result)   Collection Time: 04/01/20  6:36 PM   Specimen: BLOOD RIGHT HAND  Result Value Ref Range   Specimen Description BLOOD RIGHT HAND    Special Requests      BOTTLES DRAWN AEROBIC AND ANAEROBIC Blood Culture adequate volume   Culture      NO GROWTH 2 DAYS Performed at [facility], [place] [address], [place], [place] [address]    Report Status PENDING   Hemoglobin A1c     Status: None   Collection Time: 04/01/20  6:51 PM  Result Value Ref Range   Hgb A1c MFr Bld 5.2 4.8 - 5.6 %    Comment: (NOTE) Pre diabetes:          5.7%-6.4%  Diabetes:              >6.4%  Glycemic control for   <7.0% adults with diabetes    Mean Plasma Glucose 102.54 mg/dL    Comment: Performed at [facility] [address], [place], [place] [address]  Lactic acid, plasma     Status: None   Collection Time: 04/01/20  8:05 PM  Result Value Ref Range   Lactic Acid, Venous 1.1 0.5 - 1.9 mmol/L    Comment: Performed at [facility] [address] [place], [place] [address]  MRSA PCR Screening     Status: None   Collection Time: 04/01/20 11:50 PM   Specimen: Nasal Mucosa; Nasopharyngeal  Result Value Ref Range   MRSA by PCR NEGATIVE NEGATIVE    Comment:        The GeneXpert MRSA Assay (FDA approved for  NASAL specimens only), is one component of a comprehensive MRSA colonization surveillance program. It is not intended to diagnose MRSA infection nor to guide or monitor treatment for MRSA infections. Performed at [facility], [place] [address], [place], [place] [address]   Sedimentation rate     Status: None   Collection Time: 04/02/20 12:45 AM  Result Value Ref Range   Sed Rate 10 0 - 16 mm/hr    Comment: Performed at [facility] [address] [place], [place] [address]  C-reactive protein     Status: Abnormal   Collection Time: 04/02/20 12:45 AM  Result Value Ref Range   CRP 5.5 (H) <1.0 mg/dL    Comment: Performed at [facility] [address], [place],  [address]  Prealbumin     Status: Abnormal   Collection Time: 04/02/20 12:45 AM  Result Value Ref Range   Prealbumin 14.7 (L) 18 - 38 mg/dL    Comment: Performed at [facility] [address] [place], [place] [address]  HIV Antibody (routine testing w rflx)     Status: None   Collection Time: 04/02/20 12:45 AM  Result Value Ref Range   HIV Screen 4th Generation wRfx Non Reactive Non Reactive    Comment: Performed at [facility], [place] [address]  [place], [place] [address]  Magnesium     Status: None   Collection Time: 04/02/20 12:45 AM  Result Value Ref Range   Magnesium 2.2 1.7 - 2.4 mg/dL    Comment: Performed at [facility] [address], [place], [place] [address]  Phosphorus     Status: None   Collection Time: 04/02/20 12:45 AM  Result Value Ref Range   Phosphorus 2.8 2.5 - 4.6 mg/dL    Comment: Performed at [facility] [address] [place], [place] [address]  CBC WITH DIFFERENTIAL     Status: Abnormal   Collection Time: 04/02/20 12:45 AM  Result Value Ref Range   WBC 6.7 4.0 - 10.5 K/uL   RBC 4.11 (L) 4.22 - 5.81 MIL/uL   Hemoglobin 12.5 (L) 13.0 - 17.0 g/dL   HCT 36.8 (L) 39.0 - 52.0 %   MCV 89.5 80.0 - 100.0 fL   MCH 30.4 26.0 - 34.0 pg   MCHC  34.0 30.0 - 36.0 g/dL   RDW 13.3 11.5 - 15.5 %   Platelets 208 150 - 400 K/uL   nRBC 0.0 0.0 - 0.2 %   Neutrophils Relative % 66 %   Neutro Abs 4.5 1.7 - 7.7 K/uL   Lymphocytes Relative 18 %   Lymphs Abs 1.2 0.7 - 4.0 K/uL   Monocytes Relative 11 %   Monocytes Absolute 0.7 0.1 - 1.0 K/uL   Eosinophils Relative 4 %   Eosinophils Absolute 0.3 0.0 - 0.5 K/uL   Basophils Relative 1 %   Basophils Absolute 0.0 0.0 - 0.1 K/uL   Immature Granulocytes 0 %   Abs Immature Granulocytes 0.02 0.00 - 0.07 K/uL    Comment: Performed at [facility], [address] [place], [place] [address]  TSH     Status: None   Collection Time: 04/02/20 12:45 AM  Result Value Ref Range   TSH 0.872 0.350 - 4.500 uIU/mL    Comment: Performed by a 3rd Generation assay with a functional sensitivity of <=0.01 uIU/mL. Performed at [facility], [place] [address], [place], [place] [address]   Comprehensive metabolic panel     Status: Abnormal   Collection Time: 04/02/20 12:45 AM  Result Value Ref Range   Sodium 138 135 - 145 mmol/L   Potassium 3.3 (L) 3.5 - 5.1 mmol/L   Chloride 107 98 - 111 mmol/L   CO2 26 22 - 32 mmol/L   Glucose, Bld 156 (H) 70 - 99 mg/dL    Comment: Glucose reference range applies only to samples taken after fasting for at least 8 hours.   BUN 12 8 - 23 mg/dL   Creatinine, Ser 0.90 0.61 - 1.24 mg/dL   Calcium 8.3 (L) 8.9 - 10.3 mg/dL   Total Protein 5.8 (L) 6.5 - 8.1 g/dL   Albumin 2.9 (L) 3.5 - 5.0 g/dL   AST 17 15 - 41 U/L   ALT 12 0 - 44 U/L   Alkaline Phosphatase 54 38 - 126 U/L   Total Bilirubin 0.6 0.3 - 1.2 mg/dL   GFR, Estimated >60 >60 mL/min    Comment: (NOTE) Calculated using the CKD-EPI Creatinine Equation (2021)    Anion gap 5 5 - 15    Comment: Performed at [facility], [place] [address] [place], [place] [address]  Hepatic function panel     Status: Abnormal   Collection Time: 04/02/20 12:45 AM  Result Value Ref Range   Total  Protein 5.8 (L) 6.5 - 8.1  g/dL   Albumin 2.8 (L) 3.5 - 5.0 g/dL   AST 17 15 - 41 U/L   ALT 13 0 - 44 U/L   Alkaline Phosphatase 55 38 - 126 U/L   Total Bilirubin 0.5 0.3 - 1.2 mg/dL   Bilirubin, Direct <0.1 0.0 - 0.2 mg/dL   Indirect Bilirubin NOT CALCULATED 0.3 - 0.9 mg/dL    Comment: Performed at [facility] [address], [place], [place] [address]  Basic metabolic panel     Status: Abnormal   Collection Time: 04/03/20  3:49 AM  Result Value Ref Range   Sodium 138 135 - 145 mmol/L   Potassium 4.2 3.5 - 5.1 mmol/L    Comment: NO VISIBLE HEMOLYSIS   Chloride 107 98 - 111 mmol/L   CO2 26 22 - 32 mmol/L   Glucose, Bld 105 (H) 70 - 99 mg/dL    Comment: Glucose reference range applies only to samples taken after fasting for at least 8 hours.   BUN 11 8 - 23 mg/dL   Creatinine, Ser 0.94 0.61 - 1.24 mg/dL   Calcium 8.4 (L) 8.9 - 10.3 mg/dL   GFR, Estimated >60 >60 mL/min    Comment: (NOTE) Calculated using the CKD-EPI Creatinine Equation (2021)    Anion gap 5 5 - 15    Comment: Performed at [facility] [address] [place], [place] [address]  CBC with Differential/Platelet     Status: Abnormal   Collection Time: 04/03/20  3:49 AM  Result Value Ref Range   WBC 7.4 4.0 - 10.5 K/uL   RBC 3.88 (L) 4.22 - 5.81 MIL/uL   Hemoglobin 11.9 (L) 13.0 - 17.0 g/dL   HCT 37.0 (L) 39.0 - 52.0 %   MCV 95.4 80.0 - 100.0 fL   MCH 30.7 26.0 - 34.0 pg   MCHC 32.2 30.0 - 36.0 g/dL   RDW 13.5 11.5 - 15.5 %   Platelets 222 150 - 400 K/uL    Comment: REPEATED TO VERIFY   nRBC 0.0 0.0 - 0.2 %   Neutrophils Relative % 64 %   Neutro Abs 4.7 1.7 - 7.7 K/uL   Lymphocytes Relative 19 %   Lymphs Abs 1.4 0.7 - 4.0 K/uL   Monocytes Relative 10 %   Monocytes Absolute 0.8 0.1 - 1.0 K/uL   Eosinophils Relative 6 %   Eosinophils Absolute 0.4 0.0 - 0.5 K/uL   Basophils Relative 1 %   Basophils Absolute 0.1 0.0 - 0.1 K/uL   Immature Granulocytes 0 %   Abs Immature Granulocytes 0.03 0.00 - 0.07 K/uL    Comment:  Performed at [facility] [address] [place], [place] [address]  Magnesium     Status: None   Collection Time: 04/03/20  3:49 AM  Result Value Ref Range   Magnesium 2.2 1.7 - 2.4 mg/dL    Comment: Performed at [facility] [address], [place],  [address]    [place] LIMITED JOINT SPACE STRUCTURES LOW RIGHT  Result Date: 04/03/2020 CLINICAL DATA:  Right knee swelling. EXAM: ULTRASOUND RIGHT LOWER EXTREMITY LIMITED TECHNIQUE: Ultrasound examination of the lower extremity soft tissues was performed in the area of clinical concern (right knee). COMPARISON:  None. FINDINGS: There is a tubular fluid collection posterolaterally which measures 5.7 x 1.2 x 3.4 cm. Central echogenic component may reflect fat or tendon. A small amount of fluid was also noted medially. IMPRESSION:  Nonspecific predominately lateral periarticular fluid collection, likely tenosynovitis or a synovial cyst. Clinical follow-up recommended. This could be further evaluated with MRI if clinically warranted. Electronically Signed   By: Richardean Sale M.D.   On: 04/03/2020 11:48   DG Foot Complete Right  Result Date: 04/01/2020 CLINICAL DATA:  Ulcer with increased pain and swelling. EXAM: RIGHT FOOT COMPLETE - 3+ VIEW COMPARISON:  None. FINDINGS: Soft tissue ulcer subjacent to the first digit interphalangeal joint. No radiopaque foreign body. No evidence of associated osteomyelitis. No erosion or bony destruction. No fracture. Mild osteoarthritis in the midfoot and first metatarsal phalangeal joint. Moderate plantar calcaneal spur. No Achilles tendon enthesophyte. Dorsal soft tissue edema overlies the foot. IMPRESSION: 1. Soft tissue ulcer subjacent to the first digit interphalangeal joint. No evidence of osteomyelitis. No radiopaque foreign body. 2. Dorsal soft tissue edema. Electronically Signed   By: Keith Rake M.D.   On: 04/01/2020 18:23   VAS [place] ABI WITH/WO TBI  Result Date: 04/02/2020 LOWER EXTREMITY  DOPPLER STUDY Indications: Cellulitis & ulcer of RT great toe. High Risk Factors: Hypertension.  Comparison Study: No previous exams Performing Technologist: Rogelia Rohrer  Examination Guidelines: A complete evaluation includes at minimum, Doppler waveform signals and systolic blood pressure reading at the level of bilateral brachial, anterior tibial, and posterior tibial arteries, when vessel segments are accessible. Bilateral testing is considered an integral part of a complete examination. Photoelectric Plethysmograph (PPG) waveforms and toe systolic pressure readings are included as required and additional duplex testing as needed. Limited examinations for reoccurring indications may be performed as noted.  ABI Findings: +--------+------------------+-----+---------+--------+ Right   Rt Pressure (mmHg)IndexWaveform Comment  +--------+------------------+-----+---------+--------+ [id number]                    triphasic         +--------+------------------+-----+---------+--------+ PTA     168               1.14 triphasic         +--------+------------------+-----+---------+--------+ DP      156               1.06 biphasic          +--------+------------------+-----+---------+--------+ +--------+------------------+-----+---------+-------+ Left    Lt Pressure (mmHg)IndexWaveform Comment +--------+------------------+-----+---------+-------+ [id number]                    triphasic        +--------+------------------+-----+---------+-------+ PTA     171               1.16 triphasic        +--------+------------------+-----+---------+-------+ DP      178               1.21 biphasic         +--------+------------------+-----+---------+-------+  Summary: Right: Resting right ankle-brachial index is within normal range. No evidence of significant right lower extremity arterial disease. Left: Resting left ankle-brachial index is within normal range. No evidence of significant  left lower extremity arterial disease.  *See table(s) above for measurements and observations.  Electronically signed by Monica Martinez MD on 04/02/2020 at 1:51:19 PM.   Final    ECHOCARDIOGRAM COMPLETE  Result Date: 04/02/2020    ECHOCARDIOGRAM REPORT   Patient Name:   Alexander Cooper Date of Exam: 04/02/2020 Medical Rec #:  [id number]       Height:       71.0 in Accession #:    [id number]      Weight:  208.0 lb Date of Birth:  1945-02-24       BSA:          2.144 m Patient Age:    28 years        BP:           123/66 mmHg Patient Gender: M               HR:           61 bpm. Exam Location:  Inpatient Procedure: 2D Echo Indications:    murmur  History:        Patient has prior history of Echocardiogram examinations, most                 recent 01/06/2013. Risk Factors:Hypertension and Sleep Apnea.  Sonographer:    Johny Chess Referring Phys: [id number] [place]  Sonographer Comments: Suboptimal subcostal window. IMPRESSIONS  1. Mild aortic stenosis.  2. Left ventricular ejection fraction, by estimation, is 60 to 65%. The left ventricle has normal function. The left ventricle has no regional wall motion abnormalities. There is mild left ventricular hypertrophy. Left ventricular diastolic parameters are consistent with Grade I diastolic dysfunction (impaired relaxation).  3. Right ventricular systolic function is normal. The right ventricular size is normal.  4. The mitral valve is normal in structure. Trivial mitral valve regurgitation. No evidence of mitral stenosis.  5. The aortic valve is tricuspid. There is moderate calcification of the aortic valve. There is moderate thickening of the aortic valve. Aortic valve regurgitation is not visualized. Mild aortic valve stenosis. Aortic valve area, by VTI measures 1.83 cm. Aortic valve mean gradient measures 9.0 mmHg. Aortic valve Vmax measures 2.28 m/s.  6. The inferior vena cava is normal in size with greater than 50% respiratory variability, suggesting  right atrial pressure of 3 mmHg. FINDINGS  Left Ventricle: Left ventricular ejection fraction, by estimation, is 60 to 65%. The left ventricle has normal function. The left ventricle has no regional wall motion abnormalities. The left ventricular internal cavity size was normal in size. There is  mild left ventricular hypertrophy. Left ventricular diastolic parameters are consistent with Grade I diastolic dysfunction (impaired relaxation). Right Ventricle: The right ventricular size is normal. No increase in right ventricular wall thickness. Right ventricular systolic function is normal. Left Atrium: Left atrial size was normal in size. Right Atrium: Right atrial size was normal in size. Pericardium: There is no evidence of pericardial effusion. Mitral Valve: The mitral valve is normal in structure. Trivial mitral valve regurgitation. No evidence of mitral valve stenosis. Tricuspid Valve: The tricuspid valve is normal in structure. Tricuspid valve regurgitation is trivial. No evidence of tricuspid stenosis. Aortic Valve: The aortic valve is tricuspid. There is moderate calcification of the aortic valve. There is moderate thickening of the aortic valve. Aortic valve regurgitation is not visualized. Mild aortic stenosis is present. Aortic valve mean gradient measures 9.0 mmHg. Aortic valve peak gradient measures 20.8 mmHg. Aortic valve area, by VTI measures 1.83 cm. Pulmonic Valve: The pulmonic valve was normal in structure. Pulmonic valve regurgitation is not visualized. No evidence of pulmonic stenosis. Aorta: The aortic root is normal in size and structure. Venous: The inferior vena cava is normal in size with greater than 50% respiratory variability, suggesting right atrial pressure of 3 mmHg. IAS/Shunts: There is right bowing of the interatrial septum, suggestive of elevated left atrial pressure. No atrial level shunt detected by color flow Doppler.  LEFT VENTRICLE PLAX 2D LVIDd:  5.00 cm  Diastology  LVIDs:         3.00 cm  LV e' medial:    9.46 cm/s LV PW:         1.20 cm  LV E/e' medial:  10.2 LV IVS:        1.00 cm  LV e' lateral:   10.80 cm/s LVOT diam:     2.10 cm  LV E/e' lateral: 8.9 LV SV:         76 LV SV Index:   35 LVOT Area:     3.46 cm  RIGHT VENTRICLE RV S prime:     15.80 cm/s TAPSE (M-mode): 2.9 cm LEFT ATRIUM             Index       RIGHT ATRIUM           Index LA diam:        4.70 cm 2.19 cm/m  RA Area:     17.30 cm LA Vol (A2C):   92.3 ml 43.05 ml/m RA Volume:   44.30 ml  20.66 ml/m LA Vol (A4C):   59.4 ml 27.71 ml/m LA Biplane Vol: 77.2 ml 36.01 ml/m  AORTIC VALVE AV Area (Vmax):    1.51 cm AV Area (Vmean):   1.65 cm AV Area (VTI):     1.83 cm AV Vmax:           228.00 cm/s AV Vmean:          141.000 cm/s AV VTI:            0.412 m AV Peak Grad:      20.8 mmHg AV Mean Grad:      9.0 mmHg LVOT Vmax:         99.40 cm/s LVOT Vmean:        67.150 cm/s LVOT VTI:          0.218 m LVOT/AV VTI ratio: 0.53  AORTA Ao Root diam: 3.20 cm Ao Asc diam:  3.40 cm MITRAL VALVE MV Area (PHT): 3.42 cm     SHUNTS MV Decel Time: 222 msec     Systemic VTI:  0.22 m MV E velocity: 96.10 cm/s   Systemic Diam: 2.10 cm MV A velocity: 111.00 cm/s MV E/A ratio:  0.87 Candee Furbish MD Electronically signed by Candee Furbish MD Signature Date/Time: 04/02/2020/2:22:00 PM    Final    VAS [place] LOWER EXTREMITY VENOUS (DVT) (ONLY [facility] & [facility] 7a-7p)  Result Date: 04/02/2020  Lower Venous DVT Study Indications: Edema.  Comparison Study: no prior Performing Technologist: Abram Sander RVS  Examination Guidelines: A complete evaluation includes B-mode imaging, spectral Doppler, color Doppler, and power Doppler as needed of all accessible portions of each vessel. Bilateral testing is considered an integral part of a complete examination. Limited examinations for reoccurring indications may be performed as noted. The reflux portion of the exam is performed with the patient in reverse Trendelenburg.   +---------+---------------+---------+-----------+----------+-------------------+ RIGHT    CompressibilityPhasicitySpontaneityPropertiesThrombus Aging      +---------+---------------+---------+-----------+----------+-------------------+ CFV      Full           Yes      Yes                                      +---------+---------------+---------+-----------+----------+-------------------+ SFJ      Full                                                             +---------+---------------+---------+-----------+----------+-------------------+  FV Prox  Full                                                             +---------+---------------+---------+-----------+----------+-------------------+ FV Mid   Full                                                             +---------+---------------+---------+-----------+----------+-------------------+ FV DistalFull                                                             +---------+---------------+---------+-----------+----------+-------------------+ PFV      Full                                                             +---------+---------------+---------+-----------+----------+-------------------+ POP      Full           Yes      Yes                                      +---------+---------------+---------+-----------+----------+-------------------+ PTV      Full                                                             +---------+---------------+---------+-----------+----------+-------------------+ PERO                                                  Not well visualized +---------+---------------+---------+-----------+----------+-------------------+   +---------+---------------+---------+-----------+----------+-------------------+ LEFT     CompressibilityPhasicitySpontaneityPropertiesThrombus Aging      +---------+---------------+---------+-----------+----------+-------------------+  CFV      Full           Yes      Yes                                      +---------+---------------+---------+-----------+----------+-------------------+ SFJ      Full                                                             +---------+---------------+---------+-----------+----------+-------------------+ FV Prox  Full                                                             +---------+---------------+---------+-----------+----------+-------------------+  FV Mid   Full                                                             +---------+---------------+---------+-----------+----------+-------------------+ FV DistalFull                                                             +---------+---------------+---------+-----------+----------+-------------------+ PFV      Full                                                             +---------+---------------+---------+-----------+----------+-------------------+ POP      Full           Yes      Yes                                      +---------+---------------+---------+-----------+----------+-------------------+ PTV      Full                                                             +---------+---------------+---------+-----------+----------+-------------------+ PERO                                                  Not well visualized +---------+---------------+---------+-----------+----------+-------------------+     Summary: BILATERAL: - No evidence of deep vein thrombosis seen in the lower extremities, bilaterally. - No evidence of superficial venous thrombosis in the lower extremities, bilaterally. -No evidence of popliteal cyst, bilaterally.   *See table(s) above for measurements and observations. Electronically signed by Monica Martinez MD on 04/02/2020 at 1:50:42 PM.    Final     Review of Systems  Constitutional: Negative.   Eyes: Negative.   Respiratory: Negative.    Cardiovascular: Positive for leg swelling.  Gastrointestinal: Negative.   Musculoskeletal: Positive for arthralgias and joint swelling.  Skin: Positive for rash.  Neurological: Negative.   Psychiatric/Behavioral: Negative.    Blood pressure (!) 153/74, pulse 65, temperature 98.3 F (36.8 C), resp. rate 18, SpO2 96 %. Physical Exam HENT:     Head: Normocephalic.     Mouth/Throat:     Mouth: Mucous membranes are moist.  Eyes:     Extraocular Movements: Extraocular movements intact.  Cardiovascular:     Rate and Rhythm: Normal rate.     Pulses: Normal pulses.  Pulmonary:     Effort: Pulmonary effort is normal.  Musculoskeletal:     Cervical back: Neck supple.     Comments: Right leg with swelling from the foot up to the  knee area.  There is some erythema of the leg distally.  About the knee he has some redness and swelling about the tibial tubercle at the insertion site of the patellar tendon.  There is no obvious fluctuance in that area but it is tender to palpation.  He does have some fluctuance laterally along the patellar tendon.  He is able to range his knee from 90 degrees to fully straight.  On the posterior knee region no obvious palpable swelling or Baker's cyst.  He does have rash in the entire area of the popliteal fossa.  No obvious knee effusion but some swelling in the prepatellar area.  Patient is able to range his ankle without significant difficulty.  He endorses sensation light touch distally.  Foot is warm and well-perfused.  Skin:    General: Skin is warm.     Findings: Erythema and rash present.  Neurological:     General: No focal deficit present.     Mental Status: He is alert.  Psychiatric:        Mood and Affect: Mood normal.     Assessment/Plan: Patient has small fluid collection noted on ultrasound that is extra-articular and lateral about the and appears to be tendinous type fluid.  He has swelling about the tibial tubercle and redness.  He has some  swelling within the prepatellar bursal area.  Given the area of fluctuance along the lateral patellar tendon will attempt aspiration.  I do not think he has a septic knee or septic ankle that would require surgery at this point.  He is able to range his knee and ankle without significant discomfort.  He has a rash in the posterior aspect of his knee within the popliteal fossa but no fluctuance in this area.  Procedure: After consent was obtained the lateral aspect of the patellar tendon at the site of the apparent fluctuance was sterilized with chlorhexidine.  Then using an 18-gauge needle attempted aspiration was performed.  Multiple areas were entered but no fluid was returned.  A second area that was lateral to the area of swelling at the tibial tubercle was sterilized as well.  18-gauge needle was used in this area again with unsuccessful fluid collection.  The needle was removed.  Patient tolerated this well.  A Band-Aid was placed.  Unfortunately it was a dry aspiration.  There were no further obvious areas of fluctuance or tenderness to aspirate and we decided to forego aspiration.  Again I do not feel like he has a septic knee or any need for surgery at this point.  We discussed if his symptoms worsen about his knee then he may require an MRI scan to evaluate for other fluid collections or cause for his regression.  I did place a compressive wrap about the knee.  We discussed following up with me in the clinic for evaluation if the symptoms worsen or with an orthopedist closer to his house.  He is in agreement with this plan.  Erle Crocker 04/03/2020, 2:17 PM

## 2020-04-06 LAB — CULTURE, BLOOD (ROUTINE X 2)
Culture: NO GROWTH
Culture: NO GROWTH
Special Requests: ADEQUATE

## 2020-04-16 ENCOUNTER — Ambulatory Visit
Admission: RE | Admit: 2020-04-16 | Discharge: 2020-04-16 | Disposition: A | Discharge status: Home / Self Care | Payer: Self-pay | Source: Ambulatory Visit | Attending: Internal Medicine | Admitting: Internal Medicine

## 2020-04-16 ENCOUNTER — Other Ambulatory Visit: Payer: Self-pay

## 2020-04-16 DIAGNOSIS — I700 Atherosclerosis of aorta: Secondary | ICD-10-CM | POA: Diagnosis not present

## 2020-04-16 DIAGNOSIS — E785 Hyperlipidemia, unspecified: Secondary | ICD-10-CM | POA: Diagnosis not present

## 2020-04-16 DIAGNOSIS — E7800 Pure hypercholesterolemia, unspecified: Secondary | ICD-10-CM

## 2020-04-16 DIAGNOSIS — I2510 Atherosclerotic heart disease of native coronary artery without angina pectoris: Secondary | ICD-10-CM | POA: Diagnosis not present

## 2020-04-23 DIAGNOSIS — Z5181 Encounter for therapeutic drug level monitoring: Secondary | ICD-10-CM | POA: Diagnosis not present

## 2020-04-23 DIAGNOSIS — R931 Abnormal findings on diagnostic imaging of heart and coronary circulation: Secondary | ICD-10-CM | POA: Diagnosis not present

## 2020-04-23 DIAGNOSIS — I700 Atherosclerosis of aorta: Secondary | ICD-10-CM | POA: Diagnosis not present

## 2020-04-23 DIAGNOSIS — E538 Deficiency of other specified B group vitamins: Secondary | ICD-10-CM | POA: Diagnosis not present

## 2020-05-25 DIAGNOSIS — E7800 Pure hypercholesterolemia, unspecified: Secondary | ICD-10-CM | POA: Diagnosis not present

## 2020-05-25 DIAGNOSIS — R931 Abnormal findings on diagnostic imaging of heart and coronary circulation: Secondary | ICD-10-CM | POA: Diagnosis not present

## 2020-05-25 DIAGNOSIS — E538 Deficiency of other specified B group vitamins: Secondary | ICD-10-CM | POA: Diagnosis not present

## 2020-05-25 DIAGNOSIS — Z5181 Encounter for therapeutic drug level monitoring: Secondary | ICD-10-CM | POA: Diagnosis not present

## 2020-07-01 DIAGNOSIS — E7800 Pure hypercholesterolemia, unspecified: Secondary | ICD-10-CM | POA: Diagnosis not present

## 2020-07-01 DIAGNOSIS — I10 Essential (primary) hypertension: Secondary | ICD-10-CM | POA: Diagnosis not present

## 2020-07-01 DIAGNOSIS — K219 Gastro-esophageal reflux disease without esophagitis: Secondary | ICD-10-CM | POA: Diagnosis not present

## 2020-07-20 ENCOUNTER — Ambulatory Visit: Payer: No Typology Code available for payment source | Admitting: Cardiology

## 2020-09-14 DIAGNOSIS — R931 Abnormal findings on diagnostic imaging of heart and coronary circulation: Secondary | ICD-10-CM | POA: Diagnosis not present

## 2020-09-14 DIAGNOSIS — L97512 Non-pressure chronic ulcer of other part of right foot with fat layer exposed: Secondary | ICD-10-CM | POA: Diagnosis not present

## 2020-09-14 DIAGNOSIS — E7800 Pure hypercholesterolemia, unspecified: Secondary | ICD-10-CM | POA: Diagnosis not present

## 2020-09-14 DIAGNOSIS — Z23 Encounter for immunization: Secondary | ICD-10-CM | POA: Diagnosis not present

## 2020-09-14 DIAGNOSIS — I10 Essential (primary) hypertension: Secondary | ICD-10-CM | POA: Diagnosis not present

## 2020-09-15 ENCOUNTER — Telehealth: Payer: Self-pay | Admitting: Podiatry

## 2020-09-15 NOTE — Telephone Encounter (Signed)
Dr. Lavone Orn from [facility] called and stated Dr. [name] needs to give him a call-the toe ulcer has broken open again

## 2020-09-17 ENCOUNTER — Telehealth: Payer: Self-pay | Admitting: Podiatry

## 2020-09-17 NOTE — Telephone Encounter (Signed)
Dr.Griffin called about Alexander Cooper , he wants you to give him a call back at [phone]

## 2020-09-17 NOTE — Telephone Encounter (Signed)
Just spoke with Dr. [name], can we please call patient and get him in for an appt with me as soon as possible. Nothing crazy urgent. Thanks!

## 2020-09-20 NOTE — Telephone Encounter (Signed)
Please schedule

## 2020-09-21 ENCOUNTER — Ambulatory Visit (INDEPENDENT_AMBULATORY_CARE_PROVIDER_SITE_OTHER): Payer: Medicare Other | Admitting: Orthopedic Surgery

## 2020-09-21 ENCOUNTER — Encounter: Payer: Self-pay | Admitting: Orthopedic Surgery

## 2020-09-21 ENCOUNTER — Ambulatory Visit (INDEPENDENT_AMBULATORY_CARE_PROVIDER_SITE_OTHER): Payer: Medicare Other

## 2020-09-21 DIAGNOSIS — M79671 Pain in right foot: Secondary | ICD-10-CM | POA: Diagnosis not present

## 2020-09-21 DIAGNOSIS — L97511 Non-pressure chronic ulcer of other part of right foot limited to breakdown of skin: Secondary | ICD-10-CM | POA: Diagnosis not present

## 2020-09-21 NOTE — Progress Notes (Signed)
Office Visit Note   Patient: Alexander Cooper           Date of Birth: 05/07/1945           MRN: [id number] Visit Date: 09/21/2020              Requested by: Lavone Orn, MD [address] [place] [address] [place],  [place] [address] PCP: Lavone Orn, MD  Chief Complaint  Patient presents with   Right Foot - Open Wound    Great toe ulcer      HPI: Patient is a 75 year old gentleman who Who has had an ulcer on the plantar aspect of the right great toe for approximately 6 months.  Patient states he has previously been admitted to [facility] hospital for IV antibiotics for 2 days, however still has the persistent ulcer.  Patient denies a history of diabetes. Assessment & Plan: Visit Diagnoses: No diagnosis found.  Plan: Patient was given a postoperative shoe with a felt relieving donut continue with Dial soap cleansing Achilles stretching.  Ulcer seems to be secondary to the Achilles contracture and hallux rigidus.  Follow-Up Instructions: Return in about 2 weeks (around 10/05/2020).   Ortho Exam  Patient is alert, oriented, no adenopathy, well-dressed, normal affect, normal respiratory effort. Examination patient has a superficial ulcer on the plantar aspect the IP joint it does not probe to bone or tendon.  Patient does have good pulses there is no ascending cellulitis but there is swelling of the great toe.  Patient does have Achilles contracture with dorsiflexion to neutral with his knee extended he does have hallux rigidus with 0 degrees of dorsiflexion.  Imaging: No results found. No images are attached to the encounter.  Labs: Lab Results  Component Value Date   HGBA1C 5.2 04/01/2020   ESRSEDRATE 10 04/02/2020   CRP 5.5 (H) 04/02/2020   REPTSTATUS 04/06/2020 FINAL 04/01/2020   CULT  04/01/2020    NO GROWTH 5 DAYS Performed at [facility], [place] [address], [place], [place] [address]      Lab Results  Component Value Date   ALBUMIN 2.9 (L) 04/02/2020   ALBUMIN  2.8 (L) 04/02/2020   PREALBUMIN 14.7 (L) 04/02/2020    Lab Results  Component Value Date   MG 2.2 04/03/2020   MG 2.2 04/02/2020   No results found for: [facility]  Lab Results  Component Value Date   PREALBUMIN 14.7 (L) 04/02/2020   CBC EXTENDED Latest Ref Rng & Units 04/03/2020 04/02/2020 04/01/2020  WBC 4.0 - 10.5 K/uL 7.4 6.7 7.7  RBC 4.22 - 5.81 MIL/uL 3.88(L) 4.11(L) 4.62  HGB 13.0 - 17.0 g/dL 11.9(L) 12.5(L) 14.0  HCT 39.0 - 52.0 % 37.0(L) 36.8(L) 42.2  PLT 150 - 400 K/uL 222 208 223  NEUTROABS 1.7 - 7.7 K/uL 4.7 4.5 5.4  LYMPHSABS 0.7 - 4.0 K/uL 1.4 1.2 1.2     There is no height or weight on file to calculate BMI.  Orders:  No orders of the defined types were placed in this encounter.  No orders of the defined types were placed in this encounter.    Procedures: No procedures performed  Clinical Data: No additional findings.  ROS:  All other systems negative, except as noted in the HPI. Review of Systems  Objective: Vital Signs: There were no vitals taken for this visit.  Specialty Comments:  No specialty comments available.  PMFS History: Patient Active Problem List   Diagnosis Date Noted   Mild aortic  stenosis 04/02/2020   Hypokalemia 04/02/2020   Cellulitis 04/01/2020   Essential hypertension 04/01/2020   OSA (obstructive sleep apnea) 04/01/2020   GERD (gastroesophageal reflux disease) 04/01/2020   Foot ulcer ([place]) 04/01/2020   Past Medical History:  Diagnosis Date   GERD (gastroesophageal reflux disease)    Hypertension    Sleep apnea    Cpap does not aways use    Family History  Problem Relation Age of Onset   CAD Father     Past Surgical History:  Procedure Laterality Date   COLONOSCOPY WITH PROPOFOL N/A 08/17/2014   Procedure: COLONOSCOPY WITH PROPOFOL;  Surgeon: Garlan Fair, MD;  Location: [facility] ENDOSCOPY;  Service: Endoscopy;  Laterality: N/A;   Social History   Occupational History   Not on file  Tobacco Use   Smoking  status: Never   Smokeless tobacco: Never  Substance and Sexual Activity   Alcohol use: Yes    Comment: occasionally   Drug use: No   Sexual activity: Not on file

## 2020-09-22 ENCOUNTER — Encounter: Payer: Self-pay | Admitting: Orthopedic Surgery

## 2020-10-05 ENCOUNTER — Ambulatory Visit (INDEPENDENT_AMBULATORY_CARE_PROVIDER_SITE_OTHER): Payer: Medicare Other | Admitting: Orthopedic Surgery

## 2020-10-05 ENCOUNTER — Encounter: Payer: Self-pay | Admitting: Orthopedic Surgery

## 2020-10-05 DIAGNOSIS — L97511 Non-pressure chronic ulcer of other part of right foot limited to breakdown of skin: Secondary | ICD-10-CM | POA: Diagnosis not present

## 2020-10-05 DIAGNOSIS — M79671 Pain in right foot: Secondary | ICD-10-CM

## 2020-10-05 NOTE — Progress Notes (Signed)
Office Visit Note   Patient: Alexander Cooper           Date of Birth: 1945-02-05           MRN: [id number] Visit Date: 10/05/2020              Requested by: Lavone Orn, MD [address] [place] [address] [place],  [place] [address] PCP: Lavone Orn, MD  Chief Complaint  Patient presents with   Right Foot - Follow-up      HPI: Patient is a 75 year old gentleman who presents in follow-up for Wagner grade 1 ulcer plantar aspect IP joint right great toe he has had ulceration for 6 months.  He is currently in a postoperative shoe with a felt relieving donut.  Patient states he does not wear this all the time he has modified his sneaker and also uses regular shoewear to drive.  He is not on antibiotics.  Assessment & Plan: Visit Diagnoses:  1. Chronic ulcer of right great toe, limited to breakdown of skin (HCC)     Plan: Discussed the Achilles tightness and hallux rigidus the great toe how they impact the increased pressure to the great toe.  Discussed that we will continue to try to minimize weightbearing with the postoperative shoe and donut and at follow-up if were not showing improvement we may need to put him in a kneeling scooter or may need to consider a gastrocnemius recession and a dorsiflexion osteotomy of the first metatarsal.  Follow-Up Instructions: Return in about 4 weeks (around 11/02/2020).   Ortho Exam  Patient is alert, oriented, no adenopathy, well-dressed, normal affect, normal respiratory effort. Examination patient has good pulses he has dorsiflexion to neutral of the right ankle with the knee straight.  He has a Wagner grade 1 ulcer on the plantar aspect the IP joint that is 10 mm in diameter with healthy granulation tissue.  After informed consent a 10 blade knife was used to debride the skin and soft tissue back to healthy viable granulation tissue this was touched with silver nitrate the ulcer is 2 cm in diameter and 3 mm deep after debridement there was no  exposed bone or tendon there is no cellulitis no sausage digit swelling.  Patient has a palpable pulse.  Imaging: No results found. No images are attached to the encounter.  Labs: Lab Results  Component Value Date   HGBA1C 5.2 04/01/2020   ESRSEDRATE 10 04/02/2020   CRP 5.5 (H) 04/02/2020   REPTSTATUS 04/06/2020 FINAL 04/01/2020   CULT  04/01/2020    NO GROWTH 5 DAYS Performed at [facility], [place] [address] [place], [place] [address]      Lab Results  Component Value Date   ALBUMIN 2.9 (L) 04/02/2020   ALBUMIN 2.8 (L) 04/02/2020   PREALBUMIN 14.7 (L) 04/02/2020    Lab Results  Component Value Date   MG 2.2 04/03/2020   MG 2.2 04/02/2020   No results found for: [facility]  Lab Results  Component Value Date   PREALBUMIN 14.7 (L) 04/02/2020   CBC EXTENDED Latest Ref Rng & Units 04/03/2020 04/02/2020 04/01/2020  WBC 4.0 - 10.5 K/uL 7.4 6.7 7.7  RBC 4.22 - 5.81 MIL/uL 3.88(L) 4.11(L) 4.62  HGB 13.0 - 17.0 g/dL 11.9(L) 12.5(L) 14.0  HCT 39.0 - 52.0 % 37.0(L) 36.8(L) 42.2  PLT 150 - 400 K/uL 222 208 223  NEUTROABS 1.7 - 7.7 K/uL 4.7 4.5 5.4  LYMPHSABS 0.7 - 4.0 K/uL 1.4 1.2 1.2  There is no height or weight on file to calculate BMI.  Orders:  No orders of the defined types were placed in this encounter.  No orders of the defined types were placed in this encounter.    Procedures: No procedures performed  Clinical Data: No additional findings.  ROS:  All other systems negative, except as noted in the HPI. Review of Systems  Objective: Vital Signs: There were no vitals taken for this visit.  Specialty Comments:  No specialty comments available.  PMFS History: Patient Active Problem List   Diagnosis Date Noted   Mild aortic stenosis 04/02/2020   Hypokalemia 04/02/2020   Cellulitis 04/01/2020   Essential hypertension 04/01/2020   OSA (obstructive sleep apnea) 04/01/2020   GERD (gastroesophageal reflux disease) 04/01/2020   Foot ulcer ([place])  04/01/2020   Past Medical History:  Diagnosis Date   GERD (gastroesophageal reflux disease)    Hypertension    Sleep apnea    Cpap does not aways use    Family History  Problem Relation Age of Onset   CAD Father     Past Surgical History:  Procedure Laterality Date   COLONOSCOPY WITH PROPOFOL N/A 08/17/2014   Procedure: COLONOSCOPY WITH PROPOFOL;  Surgeon: Garlan Fair, MD;  Location: [facility];  Service: Endoscopy;  Laterality: N/A;   Social History   Occupational History   Not on file  Tobacco Use   Smoking status: Never   Smokeless tobacco: Never  Substance and Sexual Activity   Alcohol use: Yes    Comment: occasionally   Drug use: No   Sexual activity: Not on file

## 2020-10-18 ENCOUNTER — Encounter: Payer: Self-pay | Admitting: *Deleted

## 2020-11-02 ENCOUNTER — Ambulatory Visit (INDEPENDENT_AMBULATORY_CARE_PROVIDER_SITE_OTHER): Payer: Medicare Other | Admitting: Orthopedic Surgery

## 2020-11-02 ENCOUNTER — Encounter: Payer: Self-pay | Admitting: Orthopedic Surgery

## 2020-11-02 DIAGNOSIS — L97511 Non-pressure chronic ulcer of other part of right foot limited to breakdown of skin: Secondary | ICD-10-CM

## 2020-11-02 NOTE — Progress Notes (Signed)
Office Visit Note   Patient: Alexander Cooper           Date of Birth: Nov 14, 1945           MRN: [id number] Visit Date: 11/02/2020              Requested by: Lavone Orn, MD [address] [place] [address] [place],  [place] [address] PCP: Lavone Orn, MD  Chief Complaint  Patient presents with   Right Foot - Follow-up    Ulcer of Right GT      HPI: Patient is a 75 year old gentleman who presents in follow-up for a plantar ulcer right great toe IP joint.  On the last visit he was placed in a postoperative shoe with a felt relieving donut to unload pressure from the ulcer.  Patient feels like the ulcer has improved.  Assessment & Plan: Visit Diagnoses:  1. Ulcer of right great toe due to diabetes mellitus ([place])     Plan: Continue with current wound care postoperative shoe and reevaluate in 3 weeks.  Achilles stretching.  Follow-Up Instructions: Return in about 3 weeks (around 11/23/2020).   Ortho Exam  Patient is alert, oriented, no adenopathy, well-dressed, normal affect, normal respiratory effort. Examination there is no redness no cellulitis no sausage digit swelling the right great toe.  The ulcer is much flatter this does not extend down to bone or tendon.  The amount of callus around the wound is also decreased.  A 10 blade knife was used to pare the callus around the wound which is 2 cm in diameter after debridement 2 mm deep.  Patient still has a tight Achilles with dorsiflexion to neutral.  Imaging: No results found.   Labs: Lab Results  Component Value Date   HGBA1C 5.2 04/01/2020   ESRSEDRATE 10 04/02/2020   CRP 5.5 (H) 04/02/2020   REPTSTATUS 04/06/2020 FINAL 04/01/2020   CULT  04/01/2020    NO GROWTH 5 DAYS Performed at [facility], [place] [address] [place], [place] [address]      Lab Results  Component Value Date   ALBUMIN 2.9 (L) 04/02/2020   ALBUMIN 2.8 (L) 04/02/2020   PREALBUMIN 14.7 (L) 04/02/2020    Lab Results  Component Value Date    MG 2.2 04/03/2020   MG 2.2 04/02/2020   No results found for: [facility]  Lab Results  Component Value Date   PREALBUMIN 14.7 (L) 04/02/2020   CBC EXTENDED Latest Ref Rng & Units 04/03/2020 04/02/2020 04/01/2020  WBC 4.0 - 10.5 K/uL 7.4 6.7 7.7  RBC 4.22 - 5.81 MIL/uL 3.88(L) 4.11(L) 4.62  HGB 13.0 - 17.0 g/dL 11.9(L) 12.5(L) 14.0  HCT 39.0 - 52.0 % 37.0(L) 36.8(L) 42.2  PLT 150 - 400 K/uL 222 208 223  NEUTROABS 1.7 - 7.7 K/uL 4.7 4.5 5.4  LYMPHSABS 0.7 - 4.0 K/uL 1.4 1.2 1.2     There is no height or weight on file to calculate BMI.  Orders:  No orders of the defined types were placed in this encounter.  No orders of the defined types were placed in this encounter.    Procedures: No procedures performed  Clinical Data: No additional findings.  ROS:  All other systems negative, except as noted in the HPI. Review of Systems  Objective: Vital Signs: There were no vitals taken for this visit.  Specialty Comments:  No specialty comments available.  PMFS History: Patient Active Problem List   Diagnosis Date Noted   Mild aortic stenosis  04/02/2020   Hypokalemia 04/02/2020   Cellulitis 04/01/2020   Essential hypertension 04/01/2020   OSA (obstructive sleep apnea) 04/01/2020   GERD (gastroesophageal reflux disease) 04/01/2020   Foot ulcer ([place]) 04/01/2020   Past Medical History:  Diagnosis Date   Aortic atherosclerosis (HCC)    Atrial septal defect    Barrett's esophagus with low grade dysplasia    Body mass index (BMI) 31.0-31.9, adult    ED (erectile dysfunction)    GERD (gastroesophageal reflux disease)    Hypercholesterolemia    Hypertension    Obesity    Pulmonary nodule    Seasonal allergic rhinitis due to pollen    Sleep apnea    Cpap does not aways use    Family History  Problem Relation Age of Onset   CAD Father    Breast cancer Sister     Past Surgical History:  Procedure Laterality Date   COLONOSCOPY WITH PROPOFOL N/A 08/17/2014   Procedure:  COLONOSCOPY WITH PROPOFOL;  Surgeon: Garlan Fair, MD;  Location: [facility];  Service: Endoscopy;  Laterality: N/A;   Social History   Occupational History   Not on file  Tobacco Use   Smoking status: Never   Smokeless tobacco: Never  Substance and Sexual Activity   Alcohol use: Yes    Comment: occasionally   Drug use: No   Sexual activity: Not on file

## 2020-11-11 ENCOUNTER — Other Ambulatory Visit: Payer: Self-pay

## 2020-11-11 ENCOUNTER — Encounter: Payer: Self-pay | Admitting: Internal Medicine

## 2020-11-11 ENCOUNTER — Ambulatory Visit (INDEPENDENT_AMBULATORY_CARE_PROVIDER_SITE_OTHER): Payer: Medicare Other | Admitting: Internal Medicine

## 2020-11-11 VITALS — BP 126/74 | HR 66 | Ht 71.0 in | Wt 209.4 lb

## 2020-11-11 DIAGNOSIS — I10 Essential (primary) hypertension: Secondary | ICD-10-CM | POA: Diagnosis not present

## 2020-11-11 DIAGNOSIS — Z79899 Other long term (current) drug therapy: Secondary | ICD-10-CM

## 2020-11-11 DIAGNOSIS — E876 Hypokalemia: Secondary | ICD-10-CM

## 2020-11-11 DIAGNOSIS — G4733 Obstructive sleep apnea (adult) (pediatric): Secondary | ICD-10-CM | POA: Diagnosis not present

## 2020-11-11 DIAGNOSIS — I350 Nonrheumatic aortic (valve) stenosis: Secondary | ICD-10-CM | POA: Diagnosis not present

## 2020-11-11 NOTE — Progress Notes (Signed)
Cardiology Office Note   Date:  11/11/2020   ID:  Selassie, Spatafore 1945/07/30, MRN [id number]  PCP:  Lavone Orn, MD  Cardiologist:   Dorris Carnes, MD   patient presents for evaluation of CAD    History of Present Illness: Alexander Cooper is a 75 y.o. male with a history of hypertension, obstructive sleep apnea, GE reflux.  He is followed at [facility], Lavone Orn.  Note patient had a echo in 2015 that showed an atrial septal aneurysm, PFO.  Also has history of right lung nodule felt benign.  Coronary calcium score done in April 2022 score was 770.    LAD 616 left circumflex 118 RCA 36.  Also atherosclerosis of the aorta.  Lipids in the past LDL was 133 HDL 43 total 199. Patient has active.  He denies any change in his ability to do things.  He works at [employer].  Busy lifting things.  Denies shortness of breath.  He does have a history of GE reflux.  He seems to have this under control he says.  Note the patient has lost about 60 pounds.  He is worked on diet.  He continues to lose.  He is being set to be reevaluated for sleep apnea.  He has had this and has CPAP but with the weight loss this may have changed.  Current Meds  Medication Sig   aspirin EC 81 MG tablet Take 81 mg by mouth daily. Swallow whole.   chlorthalidone (HYGROTON) 25 MG tablet Take 25 mg by mouth daily.   GRAPE SEED CR PO Take 2 tablets by mouth daily.   omeprazole (PRILOSEC) 40 MG capsule Take 40 mg by mouth 2 (two) times daily.   potassium chloride SA (KLOR-CON) 20 MEQ tablet Take 20 mEq by mouth 2 (two) times daily.   psyllium (HYDROCIL/METAMUCIL) 95 % PACK Take 1 packet by mouth daily.   rosuvastatin (CRESTOR) 20 MG tablet    SILDENAFIL CITRATE PO Take by mouth.   telmisartan (MICARDIS) 40 MG tablet Take 40 mg by mouth in the morning and at bedtime.   traMADol (ULTRAM) 50 MG tablet Take 50 mg by mouth every 12 (twelve) hours as needed for moderate pain.   triamcinolone cream (KENALOG) 0.1 % Apply  1 application topically 2 (two) times daily as needed (for rash).   vitamin B-12 (CYANOCOBALAMIN) 1000 MCG tablet Take 1,000 mcg by mouth daily.     Allergies:   Codeine, Lisinopril, Losartan potassium-hctz, and Chocolate flavor   Past Medical History:  Diagnosis Date   Aortic atherosclerosis (HCC)    Atrial septal defect    Barrett's esophagus with low grade dysplasia    Body mass index (BMI) 31.0-31.9, adult    ED (erectile dysfunction)    GERD (gastroesophageal reflux disease)    Hypercholesterolemia    Hypertension    Obesity    Pulmonary nodule    Seasonal allergic rhinitis due to pollen    Sleep apnea    Cpap does not aways use    Past Surgical History:  Procedure Laterality Date   COLONOSCOPY WITH PROPOFOL N/A 08/17/2014   Procedure: COLONOSCOPY WITH PROPOFOL;  Surgeon: Garlan Fair, MD;  Location: [facility];  Service: Endoscopy;  Laterality: N/A;     Social History:  The patient  reports that he has never smoked. He has never used smokeless tobacco. He reports current alcohol use. He reports that he does not use drugs.   Family History:  The patient's  family history includes Breast cancer in his sister; CAD in his father.    ROS:  Please see the history of present illness. All other systems are reviewed and  Negative to the above problem except as noted.    PHYSICAL EXAM: VS:  BP 126/74 (BP Location: Left Arm, Patient Position: Sitting)   Pulse 66   Ht _0  (1.803 m)   Wt 209 lb 6.4 oz (95 kg)   SpO2 99%   BMI 29.21 kg/m   GEN: Well nourished, well developed, in no acute distress  HEENT: normal  Neck: no JVD  Soft murmur bilaterally  Cardiac: RRR; Gr I/VI systolic murmur base   I/VI systolic murmru at apex  ,no  LE edema  Respiratory:  clear to auscultation bilaterally, normal work of breathing GI: soft, nontender, nondistended, + BS  No hepatomegaly  MS: no deformity Moving all extremities   Skin: warm and dry, no rash Neuro:  Strength and  sensation are intact Psych: euthymic mood, full affect   EKG:  EKG is rdered today.  SR 66 bpm   Echo  04/02/20 1. Mild aortic stenosis. 2. Left ventricular ejection fraction, by estimation, is 60 to 65%. The left ventricle has normal function. The left ventricle has no regional wall motion abnormalities. There is mild left ventricular hypertrophy. Left ventricular diastolic parameters are consistent with Grade I diastolic dysfunction (impaired relaxation). 3. Right ventricular systolic function is normal. The right ventricular size is normal. 4. The mitral valve is normal in structure. Trivial mitral valve regurgitation. No evidence of mitral stenosis. 5. The aortic valve is tricuspid. There is moderate calcification of the aortic valve. There is moderate thickening of the aortic valve. Aortic valve regurgitation is not visualized. Mild aortic valve stenosis. Aortic valve area, by VTI measures 1.83 cm. Aortic valve mean gradient measures 9.0 mmHg. Aortic valve Vmax measures 2.28 m/s. 6. The inferior vena cava is normal in size with greater than 50% respiratory variability, suggesting right atrial pressure of 3 mmHg.  Lipid Panel No results found for: CHOL, TRIG, HDL, CHOLHDL, VLDL, LDLCALC, LDLDIRECT    Wt Readings from Last 3 Encounters:  11/11/20 209 lb 6.4 oz (95 kg)  08/17/14 208 lb (94.3 kg)      ASSESSMENT AND PLAN:  1.  CAD.  Patient with a calcium score of 770 with extensive calcifications in the LAD.  He is asymptomatic.  Given score I would agree with continued aspirin use.  We will check a lipoma today.  He has been on rosuvastatin for a bit cold again needs to be lower than it was when last checked.  I will also set him up for stress Myoview to rule out inducible ischemia.  2 dyslipidemia as noted above check a lipoma today as well as LP(a) in April being  3 hypertension blood pressures controlled with continued weight loss he may be able to back down on things we  will check a be met today consider backing down on chlorthalidone to 1/2 tablet with 1 potassium again he may still keep adequate control with this change but we will be in touch with him.  4.  Sleep apnea agree with plans for restarting.  5 aortic stenosis.  Mild aortic stenosis on echo in April.  6.  Carotid bruit.  Very soft murmurs bilaterally.  May be radiating from the chest.  We will get a carotid ultrasound to confirm given his plaquing noted on his coronary arteries.  7.  Pulmonary nodules.  Noted in CT report appear to be stable.  Probably old scar. Tentative follow-up for summer 2023.  We will be in touch with him regarding test results.   Current medicines are reviewed at length with the patient today.  The patient does not have concerns regarding medicines.  Signed, Dorris Carnes, MD  11/11/2020 9:23 AM    [place], [place], [place]  [address] Phone: [phone]; Fax: [phone]

## 2020-11-11 NOTE — Patient Instructions (Addendum)
Medication Instructions:  Your physician recommends that you continue on your current medications as directed. Please refer to the Current Medication list given to you today.  *If you need a refill on your cardiac medications before your next appointment, please call your pharmacy*   Lab Work: BMET, CBC, [place]   If you have labs (blood work) drawn today and your tests are completely normal, you will receive your results only by: [place] (if you have MyChart) OR A paper copy in the mail If you have any lab test that is abnormal or we need to change your treatment, we will call you to review the results.   Testing/Procedures: Your physician has requested that you have en exercise stress myoview. For further information please visit [email or web address]. Please follow instruction sheet, as given.  Your physician has requested that you have a carotid duplex. This test is an ultrasound of the carotid arteries in your neck. It looks at blood flow through these arteries that supply the brain with blood. Allow one hour for this exam. There are no restrictions or special instructions.    Follow-Up: At [facility], you and your health needs are our priority.  As part of our continuing mission to provide you with exceptional heart care, we have created designated Provider Care Teams.  These Care Teams include your primary Cardiologist (physician) and Advanced Practice Providers (APPs -  Physician Assistants and Nurse Practitioners) who all work together to provide you with the care you need, when you need it.  We recommend signing up for the patient portal called "MyChart".  Sign up information is provided on this After Visit Summary.  MyChart is used to connect with patients for Virtual Visits (Telemedicine).  Patients are able to view lab/test results, encounter notes, upcoming appointments, etc.  Non-urgent messages can be sent to your provider as well.   To learn more about what  you can do with MyChart, go to [email or web address].    Your next appointment:   9 month(s)  The format for your next appointment:   In Person  Provider:   Dr. Dorris Carnes    Other Instructions

## 2020-11-12 LAB — NMR, LIPOPROFILE
Cholesterol, Total: 182 mg/dL (ref 100–199)
HDL Particle Number: 24.7 umol/L — ABNORMAL LOW (ref >=30.5)
HDL-C: 38 mg/dL — ABNORMAL LOW (ref >39)
LDL Particle Number: 1336 nmol/L — ABNORMAL HIGH (ref <1000)
LDL Size: 20.5 nm — ABNORMAL LOW (ref >20.5)
LDL-C (NIH Calc): 115 mg/dL — ABNORMAL HIGH (ref 0–99)
LP-IR Score: 82 — ABNORMAL HIGH (ref <=45)
Small LDL Particle Number: 630 nmol/L — ABNORMAL HIGH (ref <=527)
Triglycerides: 164 mg/dL — ABNORMAL HIGH (ref 0–149)

## 2020-11-12 LAB — APOLIPOPROTEIN B: Apolipoprotein B: 93 mg/dL — ABNORMAL HIGH (ref <90)

## 2020-11-12 LAB — CBC
Hematocrit: 45.6 % (ref 37.5–51.0)
Hemoglobin: 15.3 g/dL (ref 13.0–17.7)
MCH: 29.1 pg (ref 26.6–33.0)
MCHC: 33.6 g/dL (ref 31.5–35.7)
MCV: 87 fL (ref 79–97)
Platelets: 231 10*3/uL (ref 150–450)
RBC: 5.26 x10E6/uL (ref 4.14–5.80)
RDW: 12.3 % (ref 11.6–15.4)
WBC: 7.2 10*3/uL (ref 3.4–10.8)

## 2020-11-12 LAB — BASIC METABOLIC PANEL
BUN/Creatinine Ratio: 14 (ref 10–24)
BUN: 14 mg/dL (ref 8–27)
CO2: 27 mmol/L (ref 20–29)
Calcium: 9.5 mg/dL (ref 8.6–10.2)
Chloride: 100 mmol/L (ref 96–106)
Creatinine, Ser: 1.02 mg/dL (ref 0.76–1.27)
Glucose: 104 mg/dL — ABNORMAL HIGH (ref 70–99)
Potassium: 4.2 mmol/L (ref 3.5–5.2)
Sodium: 139 mmol/L (ref 134–144)
eGFR: 78 mL/min/{1.73_m2} (ref >59)

## 2020-11-12 LAB — LIPOPROTEIN A (LPA): Lipoprotein (a): 12.8 nmol/L (ref <75.0)

## 2020-11-19 ENCOUNTER — Encounter (HOSPITAL_COMMUNITY): Payer: No Typology Code available for payment source

## 2020-11-23 ENCOUNTER — Ambulatory Visit: Payer: No Typology Code available for payment source | Admitting: Orthopedic Surgery

## 2020-11-23 ENCOUNTER — Telehealth: Payer: Self-pay

## 2020-11-23 NOTE — Telephone Encounter (Signed)
-----   Message from Ramond Dial, [place] sent at 11/23/2020  4:32 PM EST ----- [place] Silos, can you please schedule in lipid clinic to discuss results and possible PCSK9i? ----- Message ----- From: Precious Gilding, RN Sent: 11/23/2020   4:21 PM EST To: Stephani Police, RN, Precious Gilding, RN, #  Could you please advise on Repatha.  Thanks  ----- Message ----- From: Fay Records, MD Sent: 11/14/2020  10:25 PM EST To: Stephani Police, RN  Patient with coronary artery dz on CT scan    LDL needs to be lower, 70 or below I would recomm Repathal  Keep on Crestor Check lipomed and Lp(a)

## 2020-11-23 NOTE — Telephone Encounter (Signed)
Lvm to schedule appt for pharmd lipid consult

## 2020-11-24 ENCOUNTER — Telehealth: Payer: Self-pay

## 2020-11-24 NOTE — Telephone Encounter (Signed)
Lmom to call back. 

## 2020-11-24 NOTE — Telephone Encounter (Signed)
-----   Message from Ramond Dial, [place] sent at 11/23/2020  4:32 PM EST ----- [place] Silos, can you please schedule in lipid clinic to discuss results and possible PCSK9i? ----- Message ----- From: Precious Gilding, RN Sent: 11/23/2020   4:21 PM EST To: Stephani Police, RN, Precious Gilding, RN, #  Could you please advise on Repatha.  Thanks  ----- Message ----- From: Fay Records, MD Sent: 11/14/2020  10:25 PM EST To: Stephani Police, RN  Patient with coronary artery dz on CT scan    LDL needs to be lower, 70 or below I would recomm Repathal  Keep on Crestor Check lipomed and Lp(a)

## 2020-11-30 ENCOUNTER — Ambulatory Visit (INDEPENDENT_AMBULATORY_CARE_PROVIDER_SITE_OTHER): Payer: Medicare Other | Admitting: Orthopedic Surgery

## 2020-11-30 ENCOUNTER — Other Ambulatory Visit: Payer: Self-pay

## 2020-11-30 DIAGNOSIS — L97511 Non-pressure chronic ulcer of other part of right foot limited to breakdown of skin: Secondary | ICD-10-CM | POA: Diagnosis not present

## 2020-12-01 ENCOUNTER — Encounter: Payer: Self-pay | Admitting: Orthopedic Surgery

## 2020-12-01 ENCOUNTER — Telehealth (HOSPITAL_COMMUNITY): Payer: Self-pay | Admitting: *Deleted

## 2020-12-01 NOTE — Progress Notes (Signed)
Office Visit Note   Patient: Alexander Cooper           Date of Birth: 06-05-1945           MRN: [id number] Visit Date: 11/30/2020              Requested by: Lavone Orn, MD [facility] [employer] [address],  [place] [address] PCP: Lavone Orn, MD  Chief Complaint  Patient presents with   Right Foot - Follow-up    Right great toe ulcer       HPI: Patient is a 75 year old gentleman who presents in follow-up for Wagner grade 1 ulcer plantar aspect right great toe IP joint.  Patient has been performing multiple dressing changes to the right great toe ulcer daily with antibiotic ointment.  He has been full weightbearing with a postoperative shoe and a donut.  Assessment & Plan: Visit Diagnoses:  1. Chronic ulcer of great toe of right foot, limited to breakdown of skin ([place])     Plan: Continue with current wound care patient is showing good improvement.  Follow-Up Instructions: Return in about 4 weeks (around 12/28/2020).   Ortho Exam  Patient is alert, oriented, no adenopathy, well-dressed, normal affect, normal respiratory effort. Examination there is no sausage digit swelling no odor of the right great toe IP joint.  There is no recurrent callus there is healthy granulation tissue the ulcer is 10 mm in diameter 2 mm deep.  Imaging: No results found.   Labs: Lab Results  Component Value Date   HGBA1C 5.2 04/01/2020   ESRSEDRATE 10 04/02/2020   CRP 5.5 (H) 04/02/2020   REPTSTATUS 04/06/2020 FINAL 04/01/2020   CULT  04/01/2020    NO GROWTH 5 DAYS Performed at [facility], [place] [address] [place], [place] [address]      Lab Results  Component Value Date   ALBUMIN 2.9 (L) 04/02/2020   ALBUMIN 2.8 (L) 04/02/2020   PREALBUMIN 14.7 (L) 04/02/2020    Lab Results  Component Value Date   MG 2.2 04/03/2020   MG 2.2 04/02/2020   No results found for: [facility]  Lab Results  Component Value Date   PREALBUMIN 14.7 (L) 04/02/2020   CBC EXTENDED Latest  Ref Rng & Units 11/11/2020 04/03/2020 04/02/2020  WBC 3.4 - 10.8 x10E3/uL 7.2 7.4 6.7  RBC 4.14 - 5.80 x10E6/uL 5.26 3.88(L) 4.11(L)  HGB 13.0 - 17.7 g/dL 15.3 11.9(L) 12.5(L)  HCT 37.5 - 51.0 % 45.6 37.0(L) 36.8(L)  PLT 150 - 450 x10E3/uL 231 222 208  NEUTROABS 1.7 - 7.7 K/uL - 4.7 4.5  LYMPHSABS 0.7 - 4.0 K/uL - 1.4 1.2     There is no height or weight on file to calculate BMI.  Orders:  No orders of the defined types were placed in this encounter.  No orders of the defined types were placed in this encounter.    Procedures: No procedures performed  Clinical Data: No additional findings.  ROS:  All other systems negative, except as noted in the HPI. Review of Systems  Objective: Vital Signs: There were no vitals taken for this visit.  Specialty Comments:  No specialty comments available.  PMFS History: Patient Active Problem List   Diagnosis Date Noted   Mild aortic stenosis 04/02/2020   Hypokalemia 04/02/2020   Cellulitis 04/01/2020   Essential hypertension 04/01/2020   OSA (obstructive sleep apnea) 04/01/2020   GERD (gastroesophageal reflux disease) 04/01/2020   Foot ulcer ([place]) 04/01/2020   Past  Medical History:  Diagnosis Date   Aortic atherosclerosis (HCC)    Atrial septal defect    Barrett's esophagus with low grade dysplasia    Body mass index (BMI) 31.0-31.9, adult    ED (erectile dysfunction)    GERD (gastroesophageal reflux disease)    Hypercholesterolemia    Hypertension    Obesity    Pulmonary nodule    Seasonal allergic rhinitis due to pollen    Sleep apnea    Cpap does not aways use    Family History  Problem Relation Age of Onset   CAD Father    Breast cancer Sister     Past Surgical History:  Procedure Laterality Date   COLONOSCOPY WITH PROPOFOL N/A 08/17/2014   Procedure: COLONOSCOPY WITH PROPOFOL;  Surgeon: Garlan Fair, MD;  Location: [facility];  Service: Endoscopy;  Laterality: N/A;   Social History   Occupational  History   Not on file  Tobacco Use   Smoking status: Never   Smokeless tobacco: Never  Substance and Sexual Activity   Alcohol use: Yes    Comment: occasionally   Drug use: No   Sexual activity: Not on file

## 2020-12-01 NOTE — Telephone Encounter (Signed)
Close encounter 

## 2020-12-02 ENCOUNTER — Telehealth (HOSPITAL_COMMUNITY): Payer: Self-pay | Admitting: *Deleted

## 2020-12-02 NOTE — Telephone Encounter (Signed)
Close encounter 

## 2020-12-03 ENCOUNTER — Ambulatory Visit (HOSPITAL_BASED_OUTPATIENT_CLINIC_OR_DEPARTMENT_OTHER)
Admission: RE | Admit: 2020-12-03 | Discharge: 2020-12-03 | Disposition: A | Discharge status: Home / Self Care | Payer: Medicare Other | Source: Ambulatory Visit | Attending: Cardiology | Admitting: Cardiology

## 2020-12-03 ENCOUNTER — Ambulatory Visit (HOSPITAL_COMMUNITY)
Admission: RE | Admit: 2020-12-03 | Discharge: 2020-12-03 | Disposition: A | Discharge status: Home / Self Care | Payer: Medicare Other | Source: Ambulatory Visit | Attending: Cardiovascular Disease | Admitting: Cardiovascular Disease

## 2020-12-03 ENCOUNTER — Other Ambulatory Visit: Payer: Self-pay

## 2020-12-03 ENCOUNTER — Encounter (HOSPITAL_COMMUNITY): Payer: Self-pay

## 2020-12-03 DIAGNOSIS — I10 Essential (primary) hypertension: Secondary | ICD-10-CM

## 2020-12-03 DIAGNOSIS — I350 Nonrheumatic aortic (valve) stenosis: Secondary | ICD-10-CM | POA: Insufficient documentation

## 2020-12-03 DIAGNOSIS — G4733 Obstructive sleep apnea (adult) (pediatric): Secondary | ICD-10-CM

## 2020-12-03 DIAGNOSIS — Z79899 Other long term (current) drug therapy: Secondary | ICD-10-CM | POA: Insufficient documentation

## 2020-12-03 DIAGNOSIS — E876 Hypokalemia: Secondary | ICD-10-CM

## 2020-12-03 DIAGNOSIS — R0989 Other specified symptoms and signs involving the circulatory and respiratory systems: Secondary | ICD-10-CM | POA: Diagnosis not present

## 2020-12-03 DIAGNOSIS — I2510 Atherosclerotic heart disease of native coronary artery without angina pectoris: Secondary | ICD-10-CM

## 2020-12-03 LAB — MYOCARDIAL PERFUSION IMAGING
Angina Index: 0
Base ST Depression (mm): 0 mm
Duke Treadmill Score: 1
Estimated workload: 7
Exercise duration (min): 6 min
Exercise duration (sec): 0 s
MPHR: 147 {beats}/min
Nuc Stress EF: 49 %
Peak HR: 136 {beats}/min
Percent HR: 92 %
Rest HR: 56 {beats}/min
Rest Nuclear Isotope Dose: 10.5 mCi
SDS: 3
SRS: 2
SSS: 5
ST Elevation (mm): 1 mm
Stress Nuclear Isotope Dose: 29.2 mCi
TID: 1.04

## 2020-12-03 MED ORDER — TECHNETIUM TC 99M TETROFOSMIN IV KIT
29.2000 | PACK | Freq: Once | INTRAVENOUS | Status: AC | PRN
  Administered 2020-12-03: 29.2 via INTRAVENOUS
  Filled 2020-12-03: qty 30

## 2020-12-03 MED ORDER — TECHNETIUM TC 99M TETROFOSMIN IV KIT
10.5000 | PACK | Freq: Once | INTRAVENOUS | Status: AC | PRN
  Administered 2020-12-03: 10.5 via INTRAVENOUS
  Filled 2020-12-03: qty 11

## 2020-12-03 NOTE — Progress Notes (Signed)
MPI and EKG reviewed by Dr. Claiborne Billings prior to patient leaving facility. Pt is to follow up with Dr. Harrington Challenger.

## 2020-12-13 ENCOUNTER — Telehealth: Payer: Self-pay | Admitting: Internal Medicine

## 2020-12-13 NOTE — Telephone Encounter (Signed)
Follow Up:     Patient is returning call from 12-09-20, concerning his test results.

## 2020-12-13 NOTE — Telephone Encounter (Signed)
The patient has been notified of the result and verbalized understanding.  All questions (if any) were answered. Darrell Jewel, RN 12/13/2020 2:51 PM    Results given for stress test and vas [place] of carotid.

## 2020-12-30 ENCOUNTER — Ambulatory Visit (INDEPENDENT_AMBULATORY_CARE_PROVIDER_SITE_OTHER): Payer: Medicare Other | Admitting: Pharmacist

## 2020-12-30 ENCOUNTER — Other Ambulatory Visit: Payer: Self-pay

## 2020-12-30 ENCOUNTER — Telehealth: Payer: Self-pay | Admitting: Pharmacist

## 2020-12-30 DIAGNOSIS — E785 Hyperlipidemia, unspecified: Secondary | ICD-10-CM | POA: Diagnosis not present

## 2020-12-30 DIAGNOSIS — I350 Nonrheumatic aortic (valve) stenosis: Secondary | ICD-10-CM

## 2020-12-30 NOTE — Patient Instructions (Signed)
I will submit a prior authorization for Repatha or Praluent Please call me at [phone] with any questions

## 2020-12-30 NOTE — Progress Notes (Signed)
Patient ID: Alexander Cooper                 DOB: March 30, 1945                    MRN: [id number]     HPI: Alexander Cooper is a 75 y.o. male patient referred to lipid clinic by Dr. Harrington Challenger. PMH is significant for hypertension, obstructive sleep apnea, GE reflux. Note patient had a echo in 2015 that showed an atrial septal aneurysm, PFO.  Also has history of right lung nodule felt benign.  Coronary calcium score done in April 2022 score was 770. LAD 616 left circumflex 118 RCA 36.  Also atherosclerosis of the aorta.  Lipids in the past LDL was 133 HDL 43 total 199.  Patient presents today to lipid clinic. 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 He states when he first started taking rosuvastatin he thought it affected his GI tract but its improved now. This is the only medication he has taken for his cholesterol.  Current Medications: rosuvastatin 20mg  daily Risk Factors: CAD on CT CAC of 770, HTN LDL goal: <70  Diet: a lot of fruit and vegetables, chicken or seafood, steak hamburger occasionally, nuts pecans almonds, stir fry, doesn't cook with oil, cereal (cheerios or oatmeal- steel cut oats) Does like his sweets- has cut back  Drink: coffee, milk, water, occasionally wine  Exercise: works in produce department at [employer] - lifts heavy items, works in the yard, stretching exercises  Family History: The patient's family history includes Breast cancer in his sister; CAD in his father.   Social History:   The patient  reports that he has never smoked. He has never used smokeless tobacco. He reports current alcohol use. He reports that he does not use drugs.   Labs: 11/11/20 ApoB 93, LPa 12.8, LDL-P 1336, LDL-C 115, HDL-C 38, TG 164, LDL size 20.5, LP-IR 82 (rosuvastatin 20mg  daily)  Past Medical History:  Diagnosis Date   Aortic atherosclerosis (HCC)    Atrial septal defect    Barrett's esophagus with low grade dysplasia    Body mass index (BMI) 31.0-31.9, adult     ED (erectile dysfunction)    GERD (gastroesophageal reflux disease)    Hypercholesterolemia    Hypertension    Obesity    Pulmonary nodule    Seasonal allergic rhinitis due to pollen    Sleep apnea    Cpap does not aways use    Current Outpatient Medications on File Prior to Visit  Medication Sig Dispense Refill   amLODipine (NORVASC) 2.5 MG tablet Take 2.5 mg by mouth daily.     aspirin EC 81 MG tablet Take 81 mg by mouth daily. Swallow whole.     chlorthalidone (HYGROTON) 25 MG tablet Take 25 mg by mouth daily.     GRAPE SEED CR PO Take 2 tablets by mouth daily.     omeprazole (PRILOSEC) 40 MG capsule Take 40 mg by mouth 2 (two) times daily.     potassium chloride SA (KLOR-CON) 20 MEQ tablet Take 20 mEq by mouth 2 (two) times daily.     psyllium (HYDROCIL/METAMUCIL) 95 % PACK Take 1 packet by mouth daily.     rosuvastatin (CRESTOR) 20 MG tablet      SILDENAFIL CITRATE PO Take by mouth.     telmisartan (MICARDIS) 40 MG tablet Take 40 mg by mouth in the morning and at bedtime.  traMADol (ULTRAM) 50 MG tablet Take 50 mg by mouth every 12 (twelve) hours as needed for moderate pain.     triamcinolone cream (KENALOG) 0.1 % Apply 1 application topically 2 (two) times daily as needed (for rash).     vitamin B-12 (CYANOCOBALAMIN) 1000 MCG tablet Take 1,000 mcg by mouth daily.     No current facility-administered medications on file prior to visit.    Allergies  Allergen Reactions   Codeine Nausea And Vomiting   Lisinopril Cough   Losartan Potassium-Hctz Other (See Comments)    Lethargic/moody   Chocolate Flavor Rash    Assessment/Plan:  1. Hyperlipidemia - LDL-C is above goal of <70. LDL-P and Apo-B also elevated. We discussed adding PCSK9i to lower his risk of heart attack and stroke. We discussed that preventative medications are to be taken until you no longer wish to prevent these events or life expectancy is not more than a few years. Reviewed PCSK9i injection technique,  side effects and cost. Will submit prior authorization for Repatha. Will apply for [employer] grant once approved. Pt would like rx sent to [facility] in [place], [place] [phone].  Patient information for Repatha was provided to patient per request.   Thank you,   Ramond Dial, Pharm.D, BCPS, CPP [place]  [address], [place], [place] [address]  Phone: [phone]; Fax: [phone]

## 2020-12-30 NOTE — Telephone Encounter (Signed)
Repatha approved through 06/28/21.

## 2020-12-31 NOTE — Telephone Encounter (Signed)
Called pt - LVM on home phone. Called cell. No answer and VM full. Repatha approved. Will confirm with patient he wishes to start therapy before submitted for [employer] grant

## 2020-12-31 NOTE — Telephone Encounter (Signed)
Spoke to patient. He states his nickname is Alexander Cooper He would like to proceed with grant and Repatha. He was selected for income verification.  He will send me a copy of his tax return for me to submit.  CARD NO. [id number]   CARD STATUS Inactive   BIN [id number]   PCN [id number]   PC GROUP [id number]

## 2021-01-11 ENCOUNTER — Ambulatory Visit: Payer: Medicare Other | Admitting: Orthopedic Surgery

## 2021-02-08 ENCOUNTER — Other Ambulatory Visit: Payer: Self-pay

## 2021-02-08 ENCOUNTER — Ambulatory Visit (INDEPENDENT_AMBULATORY_CARE_PROVIDER_SITE_OTHER): Payer: Medicare Other | Admitting: Orthopedic Surgery

## 2021-02-08 DIAGNOSIS — L97511 Non-pressure chronic ulcer of other part of right foot limited to breakdown of skin: Secondary | ICD-10-CM

## 2021-02-13 ENCOUNTER — Encounter: Payer: Self-pay | Admitting: Orthopedic Surgery

## 2021-02-13 NOTE — Progress Notes (Signed)
Office Visit Note   Patient: Alexander Cooper           Date of Birth: 04-10-1945           MRN: [id number] Visit Date: 02/08/2021              Requested by: Lavone Orn, MD [address] [place],  [place] [address] PCP: Lavone Orn, MD  Chief Complaint  Patient presents with   Right Foot - Follow-up    Ulcer GT       HPI: Patient is a 76 year old gentleman who presents in follow-up for chronic ulcer right great toe.  He has been weightbearing in a postoperative shoe with a felt relieving donut.  He feels like he is showing slow improvement.  Assessment & Plan: Visit Diagnoses:  1. Chronic ulcer of great toe of right foot, limited to breakdown of skin ([place])     Plan: Ulcer was debrided patient will continue with pressure offloading.  Follow-Up Instructions: Return in about 4 weeks (around 03/08/2021).   Ortho Exam  Patient is alert, oriented, no adenopathy, well-dressed, normal affect, normal respiratory effort. Examination there is some soft yellow callus around the wound with clear serosanguineous drainage.  After informed consent a 10 blade knife was used to debride the skin and soft tissue back to healthy viable granulation tissue.  The ulcer was 1 cm diameter prior to debridement 2 cm after debridement there is no exposed bone or tendon.  No tunneling.  Imaging: No results found. No images are attached to the encounter.  Labs: Lab Results  Component Value Date   HGBA1C 5.2 04/01/2020   ESRSEDRATE 10 04/02/2020   CRP 5.5 (H) 04/02/2020   REPTSTATUS 04/06/2020 FINAL 04/01/2020   CULT  04/01/2020    NO GROWTH 5 DAYS Performed at [facility], [place] [address], [place], [place] [address]      Lab Results  Component Value Date   ALBUMIN 2.9 (L) 04/02/2020   ALBUMIN 2.8 (L) 04/02/2020   PREALBUMIN 14.7 (L) 04/02/2020    Lab Results  Component Value Date   MG 2.2 04/03/2020   MG 2.2 04/02/2020   No results found for: [facility]  Lab  Results  Component Value Date   PREALBUMIN 14.7 (L) 04/02/2020   CBC EXTENDED Latest Ref Rng & Units 11/11/2020 04/03/2020 04/02/2020  WBC 3.4 - 10.8 x10E3/uL 7.2 7.4 6.7  RBC 4.14 - 5.80 x10E6/uL 5.26 3.88(L) 4.11(L)  HGB 13.0 - 17.7 g/dL 15.3 11.9(L) 12.5(L)  HCT 37.5 - 51.0 % 45.6 37.0(L) 36.8(L)  PLT 150 - 450 x10E3/uL 231 222 208  NEUTROABS 1.7 - 7.7 K/uL - 4.7 4.5  LYMPHSABS 0.7 - 4.0 K/uL - 1.4 1.2     There is no height or weight on file to calculate BMI.  Orders:  No orders of the defined types were placed in this encounter.  No orders of the defined types were placed in this encounter.    Procedures: No procedures performed  Clinical Data: No additional findings.  ROS:  All other systems negative, except as noted in the HPI. Review of Systems  Objective: Vital Signs: There were no vitals taken for this visit.  Specialty Comments:  No specialty comments available.  PMFS History: Patient Active Problem List   Diagnosis Date Noted   Hyperlipidemia 12/30/2020   Mild aortic stenosis 04/02/2020   Hypokalemia 04/02/2020   Cellulitis 04/01/2020   Essential hypertension 04/01/2020   OSA (obstructive sleep apnea)  04/01/2020   GERD (gastroesophageal reflux disease) 04/01/2020   Foot ulcer ([place]) 04/01/2020   Past Medical History:  Diagnosis Date   Aortic atherosclerosis (HCC)    Atrial septal defect    Barrett's esophagus with low grade dysplasia    Body mass index (BMI) 31.0-31.9, adult    ED (erectile dysfunction)    GERD (gastroesophageal reflux disease)    Hypercholesterolemia    Hypertension    Obesity    Pulmonary nodule    Seasonal allergic rhinitis due to pollen    Sleep apnea    Cpap does not aways use    Family History  Problem Relation Age of Onset   CAD Father    Breast cancer Sister     Past Surgical History:  Procedure Laterality Date   COLONOSCOPY WITH PROPOFOL N/A 08/17/2014   Procedure: COLONOSCOPY WITH PROPOFOL;  Surgeon: Garlan Fair, MD;  Location: [facility];  Service: Endoscopy;  Laterality: N/A;   Social History   Occupational History   Not on file  Tobacco Use   Smoking status: Never   Smokeless tobacco: Never  Substance and Sexual Activity   Alcohol use: Yes    Comment: occasionally   Drug use: No   Sexual activity: Not on file

## 2021-03-15 ENCOUNTER — Ambulatory Visit: Payer: Medicare Other | Admitting: Orthopedic Surgery

## 2021-04-12 ENCOUNTER — Ambulatory Visit (INDEPENDENT_AMBULATORY_CARE_PROVIDER_SITE_OTHER): Payer: Medicare Other | Admitting: Orthopedic Surgery

## 2021-04-12 DIAGNOSIS — L97511 Non-pressure chronic ulcer of other part of right foot limited to breakdown of skin: Secondary | ICD-10-CM | POA: Diagnosis not present

## 2021-04-26 ENCOUNTER — Encounter: Payer: Self-pay | Admitting: Orthopedic Surgery

## 2021-04-26 NOTE — Progress Notes (Signed)
? ?Office Visit Note ?  ?Patient: Alexander Cooper           ?Date of Birth: 08/17/1945           ?MRN: [id number] ?Visit Date: 04/12/2021 ?             ?Requested by: Lavone Orn, MD ?[address] ?[address] ?[place],  [place] [address] ?PCP: Lavone Orn, MD ? ?Chief Complaint  ?Patient presents with  ? Right Foot - Wound Check  ? ? ? ? ?HPI: ?Patient is a 76 year old gentleman who presents in follow-up for ulceration right great toe he states the ulcer has been present for about a year. ? ?Assessment & Plan: ?Visit Diagnoses:  ?1. Chronic ulcer of great toe of right foot, limited to breakdown of skin ([place])   ? ? ?Plan: Ulcer was debrided of skin and soft tissue.  Recommended resuming postoperative shoe with a felt relieving donut patient was wearing flip-flops at home.  Reevaluate in 4 weeks.  Patient was also given instructions for dorsiflexion stretching of the ankle. ? ?Follow-Up Instructions: Return in about 4 weeks (around 05/10/2021).  ? ?Ortho Exam ? ?Patient is alert, oriented, no adenopathy, well-dressed, normal affect, normal respiratory effort. ?Examination patient has a strong dorsalis pedis pulse he does have Achilles contracture with dorsiflexion only to neutral.  There is hallux rigidus with only about 20 degrees of dorsiflexion.  There is a plantar ulcer on the right great toe.  After informed consent a 10 blade knife was used to debride the skin and soft tissue back to healthy viable granulation tissue after debridement the ulcer is 2 cm in diameter 2 mm deep the ulcer was 1 cm diameter prior to debridement.  There is good healthy granulation tissue no exposed bone or tendon. ? ?Imaging: ?No results found. ?No images are attached to the encounter. ? ?Labs: ?Lab Results  ?Component Value Date  ? HGBA1C 5.2 04/01/2020  ? ESRSEDRATE 10 04/02/2020  ? CRP 5.5 (H) 04/02/2020  ? REPTSTATUS 04/06/2020 FINAL 04/01/2020  ? CULT  04/01/2020  ?  NO GROWTH 5 DAYS ?Performed at [facility], [place]  [address], [place], [place] [address] ?  ? ? ? ?Lab Results  ?Component Value Date  ? ALBUMIN 2.9 (L) 04/02/2020  ? ALBUMIN 2.8 (L) 04/02/2020  ? PREALBUMIN 14.7 (L) 04/02/2020  ? ? ?Lab Results  ?Component Value Date  ? MG 2.2 04/03/2020  ? MG 2.2 04/02/2020  ? ?No results found for: VD25OH ? ?Lab Results  ?Component Value Date  ? PREALBUMIN 14.7 (L) 04/02/2020  ? ? ?  Latest Ref Rng & Units 11/11/2020  ? 10:35 AM 04/03/2020  ?  3:49 AM 04/02/2020  ? 12:45 AM  ?CBC EXTENDED  ?WBC 3.4 - 10.8 x10E3/uL 7.2   7.4   6.7    ?RBC 4.14 - 5.80 x10E6/uL 5.26   3.88   4.11    ?Hemoglobin 13.0 - 17.7 g/dL 15.3   11.9   12.5    ?HCT 37.5 - 51.0 % 45.6   37.0   36.8    ?Platelets 150 - 450 x10E3/uL 231   222   208    ?NEUT# 1.7 - 7.7 K/uL  4.7   4.5    ?Lymph# 0.7 - 4.0 K/uL  1.4   1.2    ? ? ? ?There is no height or weight on file to calculate BMI. ? ?Orders:  ?No orders of the defined types were placed  in this encounter. ? ?No orders of the defined types were placed in this encounter. ? ? ? Procedures: ?No procedures performed ? ?Clinical Data: ?No additional findings. ? ?ROS: ? ?All other systems negative, except as noted in the HPI. ?Review of Systems ? ?Objective: ?Vital Signs: There were no vitals taken for this visit. ? ?Specialty Comments:  ?No specialty comments available. ? ?PMFS History: ?Patient Active Problem List  ? Diagnosis Date Noted  ? Hyperlipidemia 12/30/2020  ? Mild aortic stenosis 04/02/2020  ? Hypokalemia 04/02/2020  ? Cellulitis 04/01/2020  ? Essential hypertension 04/01/2020  ? OSA (obstructive sleep apnea) 04/01/2020  ? GERD (gastroesophageal reflux disease) 04/01/2020  ? Foot ulcer ([place]) 04/01/2020  ? ?Past Medical History:  ?Diagnosis Date  ? Aortic atherosclerosis ([place])   ? Atrial septal defect   ? Barrett's esophagus with low grade dysplasia   ? Body mass index (BMI) 31.0-31.9, adult   ? ED (erectile dysfunction)   ? GERD (gastroesophageal reflux disease)   ? Hypercholesterolemia   ? Hypertension   ? Obesity    ? Pulmonary nodule   ? Seasonal allergic rhinitis due to pollen   ? Sleep apnea   ? Cpap does not aways use  ?  ?Family History  ?Problem Relation Age of Onset  ? CAD Father   ? Breast cancer Sister   ?  ?Past Surgical History:  ?Procedure Laterality Date  ? COLONOSCOPY WITH PROPOFOL N/A 08/17/2014  ? Procedure: COLONOSCOPY WITH PROPOFOL;  Surgeon: Garlan Fair, MD;  Location: [facility];  Service: Endoscopy;  Laterality: N/A;  ? ?Social History  ? ?Occupational History  ? Not on file  ?Tobacco Use  ? Smoking status: Never  ? Smokeless tobacco: Never  ?Substance and Sexual Activity  ? Alcohol use: Yes  ?  Comment: occasionally  ? Drug use: No  ? Sexual activity: Not on file  ? ? ? ? ? ?

## 2021-05-10 ENCOUNTER — Ambulatory Visit: Payer: Medicare Other | Admitting: Orthopedic Surgery

## 2021-06-07 ENCOUNTER — Ambulatory Visit: Payer: Medicare Other | Admitting: Orthopedic Surgery

## 2021-07-12 DIAGNOSIS — Z0000 Encounter for general adult medical examination without abnormal findings: Secondary | ICD-10-CM | POA: Diagnosis not present

## 2021-07-12 DIAGNOSIS — I700 Atherosclerosis of aorta: Secondary | ICD-10-CM | POA: Diagnosis not present

## 2021-07-12 DIAGNOSIS — I10 Essential (primary) hypertension: Secondary | ICD-10-CM | POA: Diagnosis not present

## 2021-07-12 DIAGNOSIS — N400 Enlarged prostate without lower urinary tract symptoms: Secondary | ICD-10-CM | POA: Diagnosis not present

## 2021-07-12 DIAGNOSIS — L97512 Non-pressure chronic ulcer of other part of right foot with fat layer exposed: Secondary | ICD-10-CM | POA: Diagnosis not present

## 2021-07-12 DIAGNOSIS — Z23 Encounter for immunization: Secondary | ICD-10-CM | POA: Diagnosis not present

## 2021-07-12 DIAGNOSIS — R972 Elevated prostate specific antigen [PSA]: Secondary | ICD-10-CM | POA: Diagnosis not present

## 2021-07-12 DIAGNOSIS — E7800 Pure hypercholesterolemia, unspecified: Secondary | ICD-10-CM | POA: Diagnosis not present

## 2021-07-12 DIAGNOSIS — G4733 Obstructive sleep apnea (adult) (pediatric): Secondary | ICD-10-CM | POA: Diagnosis not present

## 2021-07-12 DIAGNOSIS — K22710 Barrett's esophagus with low grade dysplasia: Secondary | ICD-10-CM | POA: Diagnosis not present

## 2021-07-12 DIAGNOSIS — Z1331 Encounter for screening for depression: Secondary | ICD-10-CM | POA: Diagnosis not present

## 2021-07-12 DIAGNOSIS — K219 Gastro-esophageal reflux disease without esophagitis: Secondary | ICD-10-CM | POA: Diagnosis not present

## 2021-07-19 ENCOUNTER — Ambulatory Visit (INDEPENDENT_AMBULATORY_CARE_PROVIDER_SITE_OTHER): Payer: Medicare Other | Admitting: Orthopedic Surgery

## 2021-07-19 DIAGNOSIS — L97511 Non-pressure chronic ulcer of other part of right foot limited to breakdown of skin: Secondary | ICD-10-CM

## 2021-08-02 ENCOUNTER — Encounter: Payer: Self-pay | Admitting: Orthopedic Surgery

## 2021-08-02 NOTE — Progress Notes (Signed)
Office Visit Note   Patient: Alexander Cooper           Date of Birth: 1945/11/09           MRN: [id number] Visit Date: 07/19/2021              Requested by: Lavone Orn, MD [address] [place],  [place] [address] PCP: Lavone Orn, MD  Chief Complaint  Patient presents with   Right Foot - Wound Check      HPI: Patient is a 76 year old gentleman who presents in follow-up for right foot great toe ulcer currently in a postoperative shoe with felt relieving donut.  Patient has been doing Achilles stretching.  Assessment & Plan: Visit Diagnoses:  1. Chronic ulcer of great toe of right foot, limited to breakdown of skin ([place])     Plan: Patient has improved his dorsiflexion of the ankle.  Discussed the possibility of a gastrocnemius recession and the possibility of dorsiflexion osteotomy of the first metatarsal.  Follow-Up Instructions: Return in about 4 weeks (around 08/16/2021).   Ortho Exam  Patient is alert, oriented, no adenopathy, well-dressed, normal affect, normal respiratory effort. Examination patient has dorsiflexion of 10 degrees of the ankle.  There is hyperkeratotic callus beneath the base of the fifth metatarsal on the right the callus was pared.  There is an ulcer of the right great toe that is 10 mm in diameter 1 mm deep with healthy granulation tissue.  No exposed bone or tendon.  Patient does have a plantarflexed first ray.  Imaging: No results found. No images are attached to the encounter.  Labs: Lab Results  Component Value Date   HGBA1C 5.2 04/01/2020   ESRSEDRATE 10 04/02/2020   CRP 5.5 (H) 04/02/2020   REPTSTATUS 04/06/2020 FINAL 04/01/2020   CULT  04/01/2020    NO GROWTH 5 DAYS Performed at [facility], [place] [address], [place], [place] [address]      Lab Results  Component Value Date   ALBUMIN 2.9 (L) 04/02/2020   ALBUMIN 2.8 (L) 04/02/2020   PREALBUMIN 14.7 (L) 04/02/2020    Lab Results  Component Value Date    MG 2.2 04/03/2020   MG 2.2 04/02/2020   No results found for: "VD25OH"  Lab Results  Component Value Date   PREALBUMIN 14.7 (L) 04/02/2020      Latest Ref Rng & Units 11/11/2020   10:35 AM 04/03/2020    3:49 AM 04/02/2020   12:45 AM  CBC EXTENDED  WBC 3.4 - 10.8 x10E3/uL 7.2  7.4  6.7   RBC 4.14 - 5.80 x10E6/uL 5.26  3.88  4.11   Hemoglobin 13.0 - 17.7 g/dL 15.3  11.9  12.5   HCT 37.5 - 51.0 % 45.6  37.0  36.8   Platelets 150 - 450 x10E3/uL 231  222  208   NEUT# 1.7 - 7.7 K/uL  4.7  4.5   Lymph# 0.7 - 4.0 K/uL  1.4  1.2      There is no height or weight on file to calculate BMI.  Orders:  No orders of the defined types were placed in this encounter.  No orders of the defined types were placed in this encounter.    Procedures: No procedures performed  Clinical Data: No additional findings.  ROS:  All other systems negative, except as noted in the HPI. Review of Systems  Objective: Vital Signs: There were no vitals taken for this visit.  Specialty Comments:  No specialty comments available.  PMFS History: Patient Active Problem List   Diagnosis Date Noted   Hyperlipidemia 12/30/2020   Mild aortic stenosis 04/02/2020   Hypokalemia 04/02/2020   Cellulitis 04/01/2020   Essential hypertension 04/01/2020   OSA (obstructive sleep apnea) 04/01/2020   GERD (gastroesophageal reflux disease) 04/01/2020   Foot ulcer ([place]) 04/01/2020   Past Medical History:  Diagnosis Date   Aortic atherosclerosis (HCC)    Atrial septal defect    Barrett's esophagus with low grade dysplasia    Body mass index (BMI) 31.0-31.9, adult    ED (erectile dysfunction)    GERD (gastroesophageal reflux disease)    Hypercholesterolemia    Hypertension    Obesity    Pulmonary nodule    Seasonal allergic rhinitis due to pollen    Sleep apnea    Cpap does not aways use    Family History  Problem Relation Age of Onset   CAD Father    Breast cancer Sister     Past Surgical History:   Procedure Laterality Date   COLONOSCOPY WITH PROPOFOL N/A 08/17/2014   Procedure: COLONOSCOPY WITH PROPOFOL;  Surgeon: Garlan Fair, MD;  Location: [facility];  Service: Endoscopy;  Laterality: N/A;   Social History   Occupational History   Not on file  Tobacco Use   Smoking status: Never   Smokeless tobacco: Never  Substance and Sexual Activity   Alcohol use: Yes    Comment: occasionally   Drug use: No   Sexual activity: Not on file

## 2021-08-23 ENCOUNTER — Ambulatory Visit: Payer: Medicare Other | Admitting: Orthopedic Surgery

## 2021-09-13 ENCOUNTER — Ambulatory Visit: Payer: Medicare Other | Admitting: Orthopedic Surgery

## 2022-01-10 ENCOUNTER — Other Ambulatory Visit: Payer: Self-pay | Admitting: Internal Medicine

## 2022-01-10 ENCOUNTER — Ambulatory Visit
Admission: RE | Admit: 2022-01-10 | Discharge: 2022-01-10 | Disposition: A | Discharge status: Home / Self Care | Payer: Medicare Other | Source: Ambulatory Visit | Attending: Internal Medicine | Admitting: Internal Medicine

## 2022-01-10 DIAGNOSIS — N400 Enlarged prostate without lower urinary tract symptoms: Secondary | ICD-10-CM | POA: Diagnosis not present

## 2022-01-10 DIAGNOSIS — E7800 Pure hypercholesterolemia, unspecified: Secondary | ICD-10-CM | POA: Diagnosis not present

## 2022-01-10 DIAGNOSIS — L97512 Non-pressure chronic ulcer of other part of right foot with fat layer exposed: Secondary | ICD-10-CM | POA: Diagnosis not present

## 2022-01-10 DIAGNOSIS — R972 Elevated prostate specific antigen [PSA]: Secondary | ICD-10-CM | POA: Diagnosis not present

## 2022-01-10 DIAGNOSIS — M542 Cervicalgia: Secondary | ICD-10-CM | POA: Diagnosis not present

## 2022-01-10 DIAGNOSIS — G4733 Obstructive sleep apnea (adult) (pediatric): Secondary | ICD-10-CM | POA: Diagnosis not present

## 2022-01-10 DIAGNOSIS — K219 Gastro-esophageal reflux disease without esophagitis: Secondary | ICD-10-CM | POA: Diagnosis not present

## 2022-01-10 DIAGNOSIS — I700 Atherosclerosis of aorta: Secondary | ICD-10-CM | POA: Diagnosis not present

## 2022-01-10 DIAGNOSIS — K22710 Barrett's esophagus with low grade dysplasia: Secondary | ICD-10-CM | POA: Diagnosis not present

## 2022-01-10 DIAGNOSIS — I10 Essential (primary) hypertension: Secondary | ICD-10-CM | POA: Diagnosis not present

## 2022-01-10 DIAGNOSIS — Z23 Encounter for immunization: Secondary | ICD-10-CM | POA: Diagnosis not present

## 2022-02-13 DIAGNOSIS — K449 Diaphragmatic hernia without obstruction or gangrene: Secondary | ICD-10-CM | POA: Diagnosis not present

## 2022-02-13 DIAGNOSIS — K22710 Barrett's esophagus with low grade dysplasia: Secondary | ICD-10-CM | POA: Diagnosis not present

## 2022-02-13 DIAGNOSIS — K2270 Barrett's esophagus without dysplasia: Secondary | ICD-10-CM | POA: Diagnosis not present

## 2022-02-13 DIAGNOSIS — K208 Other esophagitis without bleeding: Secondary | ICD-10-CM | POA: Diagnosis not present

## 2022-11-23 IMAGING — DX DG FOOT COMPLETE 3+V*R*
3 series · 3 of 3 positions shown · non-contrast
Comparison: None.

CLINICAL DATA: Ulcer with increased pain and swelling.

EXAM:
RIGHT FOOT COMPLETE - 3+ VIEW

[foot ap]
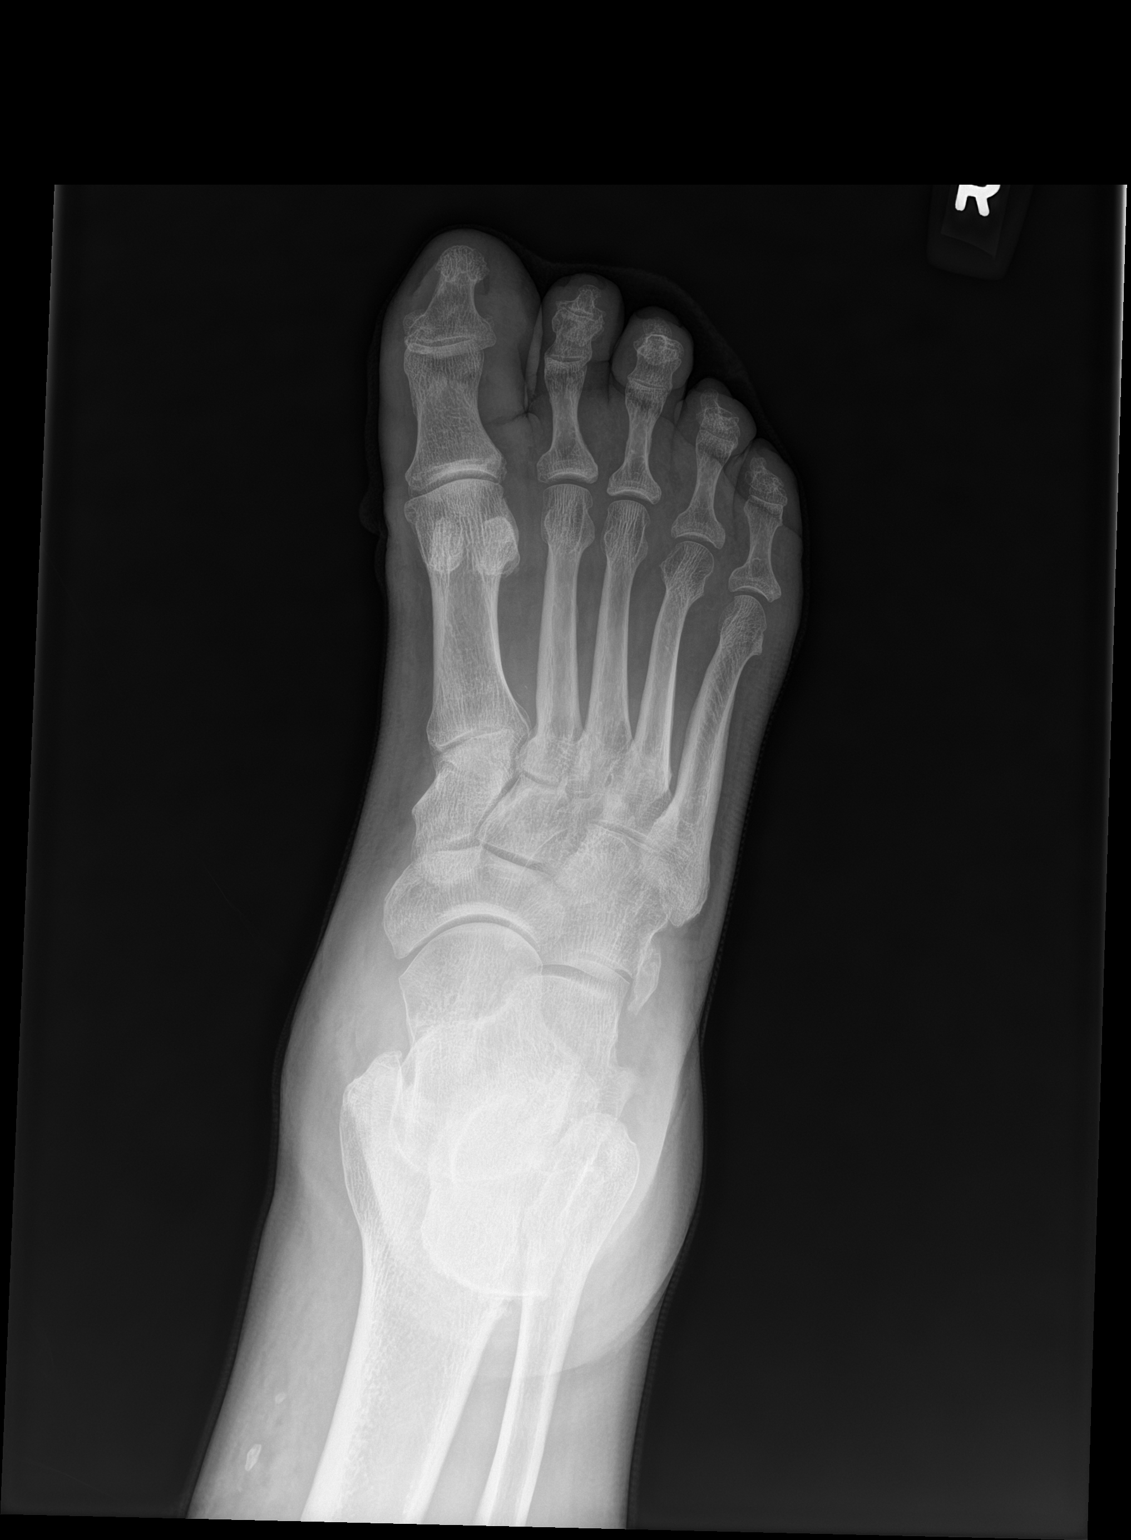

[foot obl]
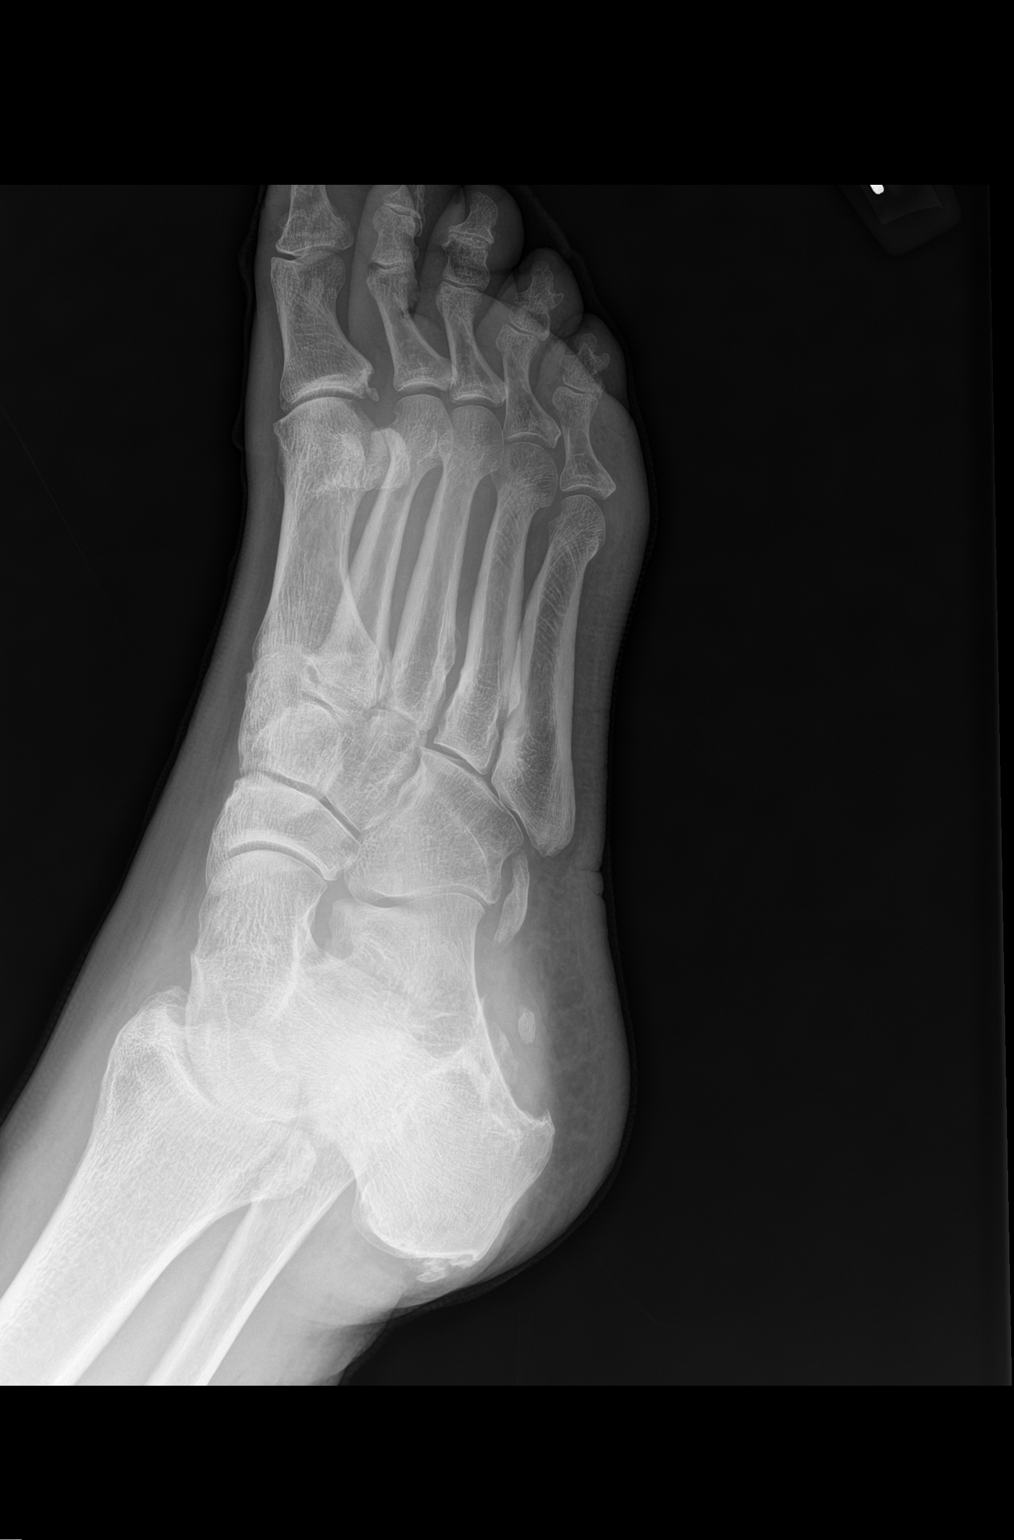

[foot lat]
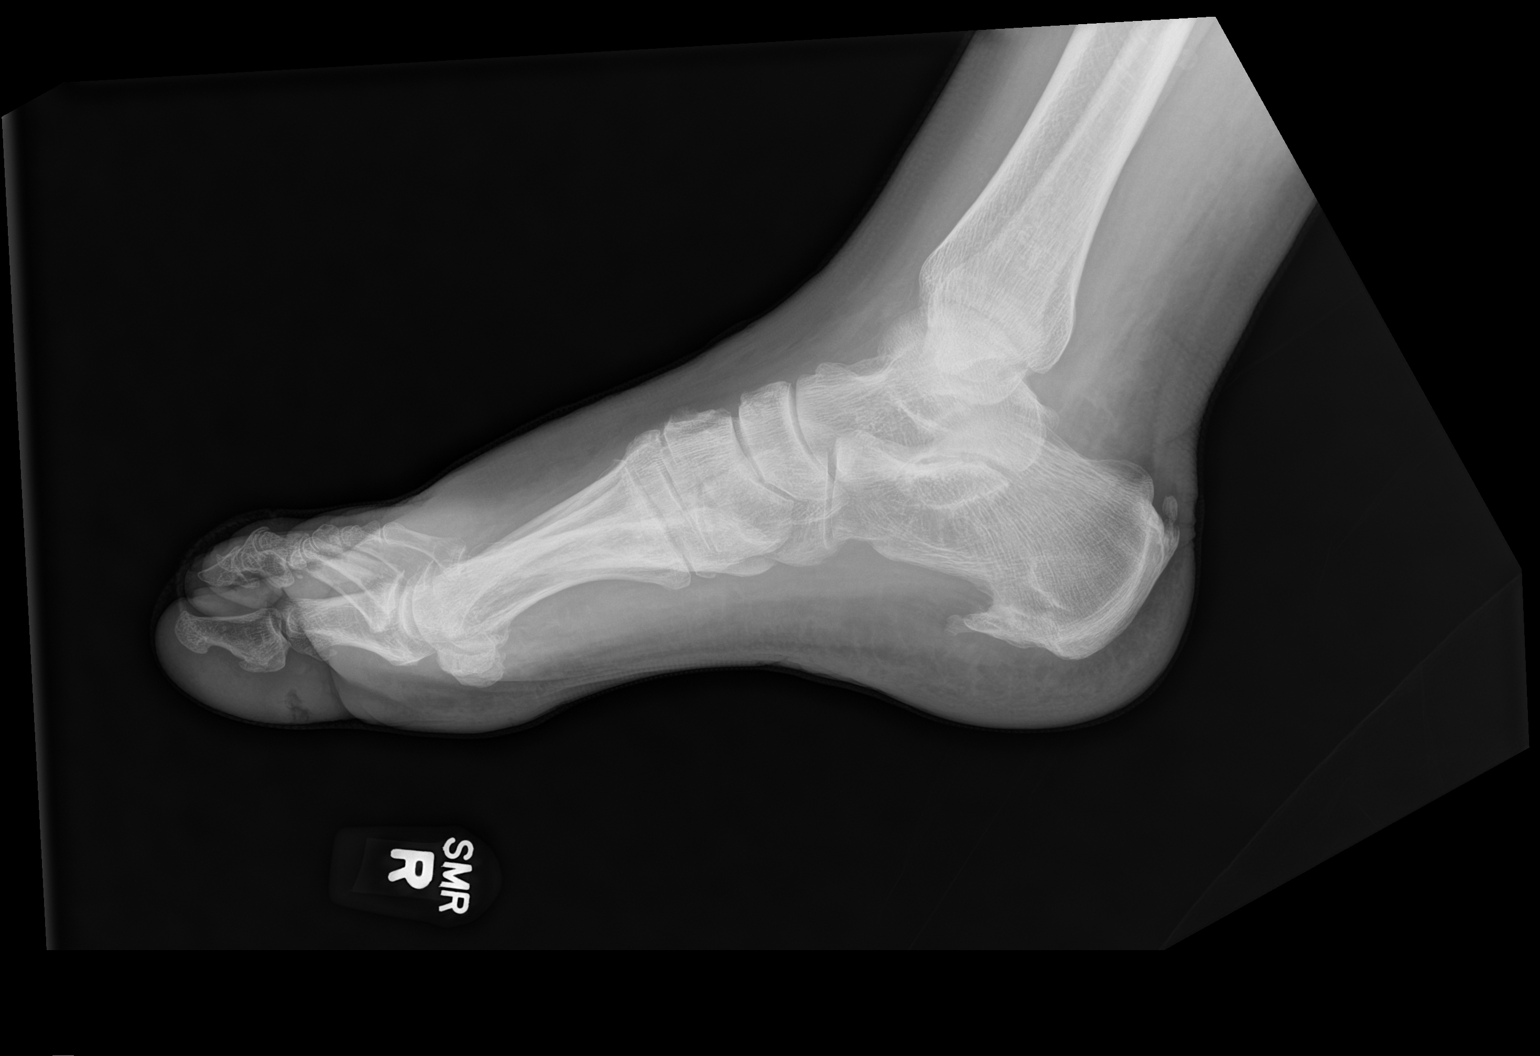

[3 of 3 positions shown; findings below may reference images not displayed]

FINDINGS: Soft tissue ulcer subjacent to the first digit interphalangeal
joint. No radiopaque foreign body. No evidence of associated
osteomyelitis. No erosion or bony destruction. No fracture. Mild
osteoarthritis in the midfoot and first metatarsal phalangeal joint.
Moderate plantar calcaneal spur. No Achilles tendon enthesophyte.
Dorsal soft tissue edema overlies the foot.
IMPRESSION: 1. Soft tissue ulcer subjacent to the first digit interphalangeal
joint. No evidence of osteomyelitis. No radiopaque foreign body.
2. Dorsal soft tissue edema.

## 2022-12-08 IMAGING — CT CT CARDIAC CORONARY ARTERY CALCIUM SCORE
3 series · 14 of 20 positions shown, 16 images · non-contrast
Comparison: 07/25/2016 chest CT.

CLINICAL DATA: Hyperlipidemia. Nonsmoker. Family history of heart
disease.

EXAM:
CT CARDIAC CORONARY ARTERY CALCIUM SCORE
TECHNIQUE: Non-contrast imaging through the heart was performed using
prospective ECG gating. Image post processing was performed on an
independent workstation, allowing for quantitative analysis of the
heart and coronary arteries. Note that this exam targets the heart
and the chest was not imaged in its entirety.

[Series 2: calcium scoring 2.00 qr36 bestdiast 69% hrt calciu · axial · 0.46mm/px · z∈[+1685,+1769]mm · 4 of 70 slices shown]
[im 14/70  vessel]
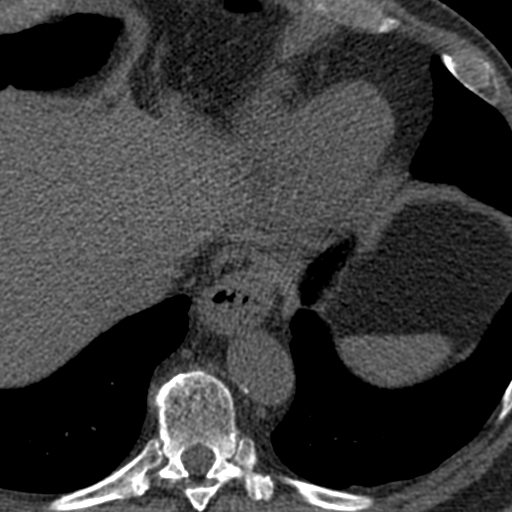
[im 28/70  vessel]
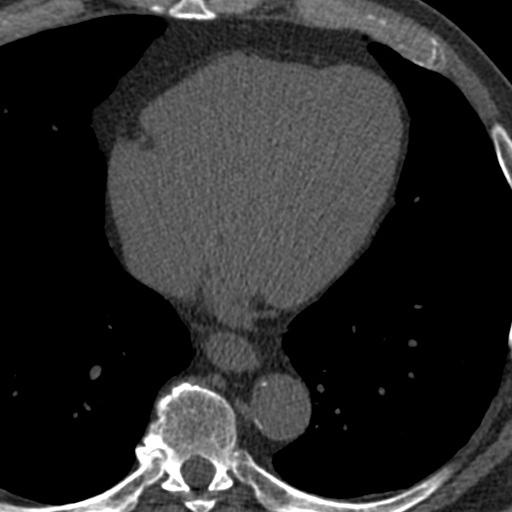
[im 42/70  vessel]
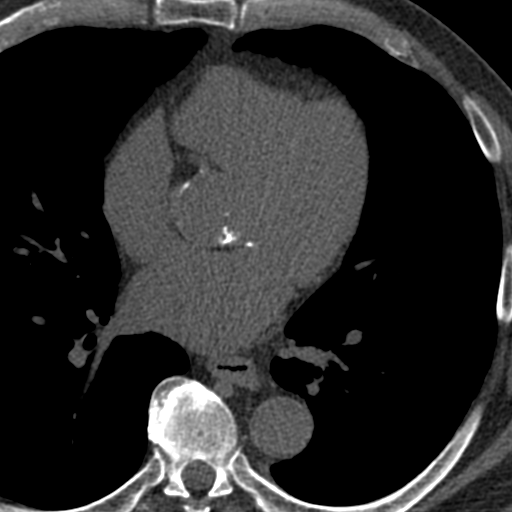
[im 56/70  vessel]
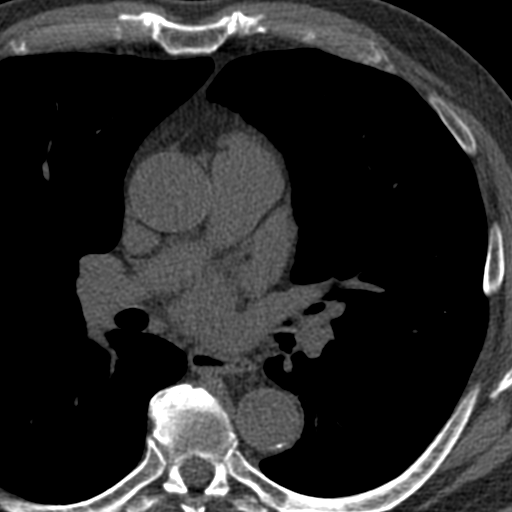

[Series 3: calcium scoring 2.00 br40 bestdiast 69% axial · axial · 0.62mm/px · z∈[+1681,+1773]mm · 5 of 70 slices shown, 7 images]
[im 12/70  vessel]
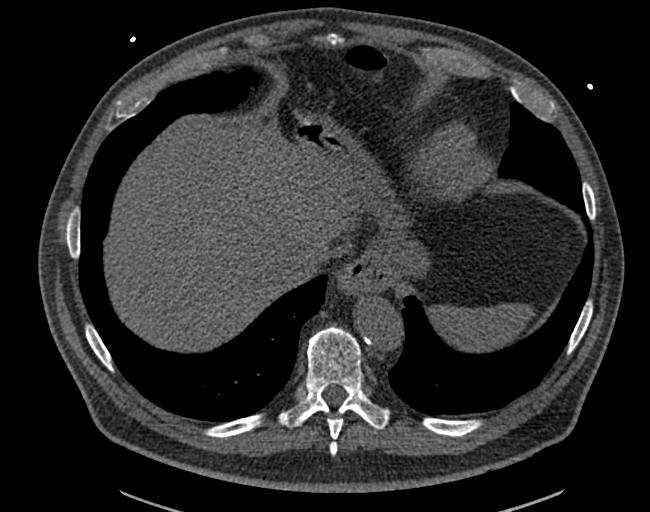
[im 12/70  lung]
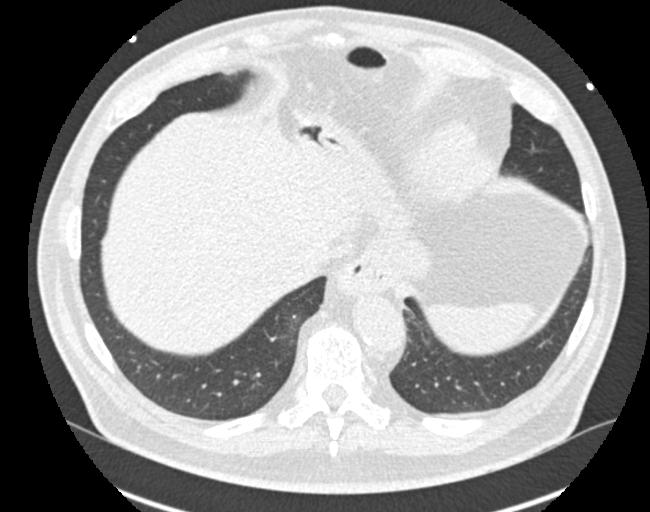
[im 24/70  vessel]
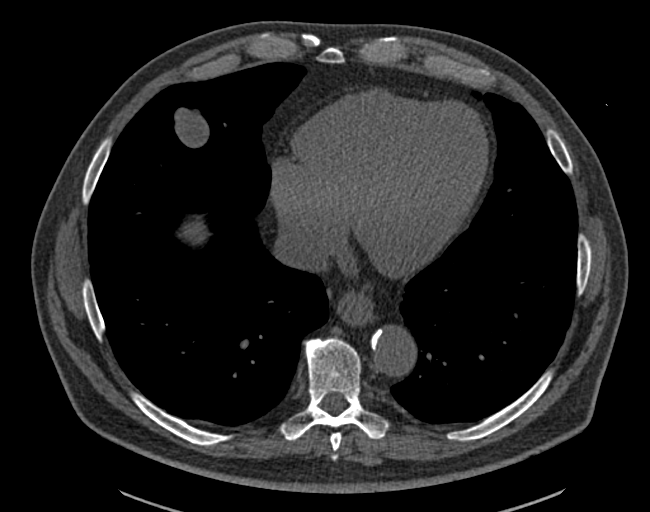
[im 35/70  vessel]
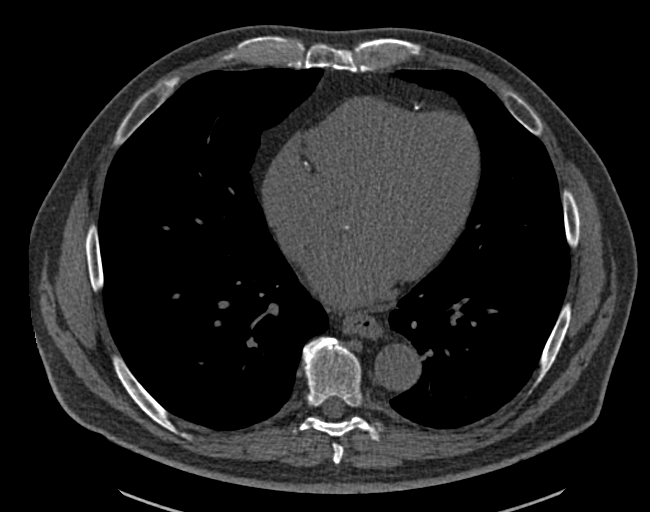
[im 47/70  vessel]
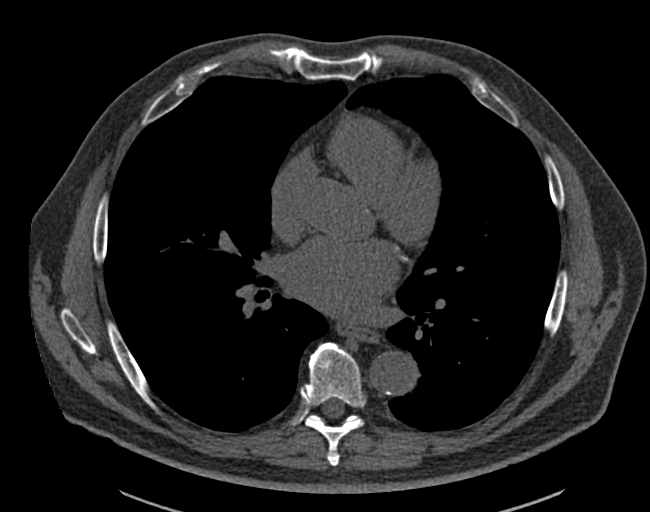
[im 58/70  vessel]
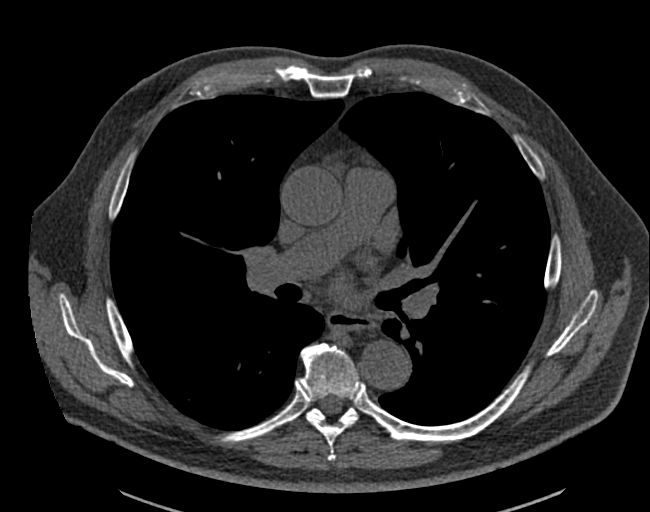
[im 58/70  lung]
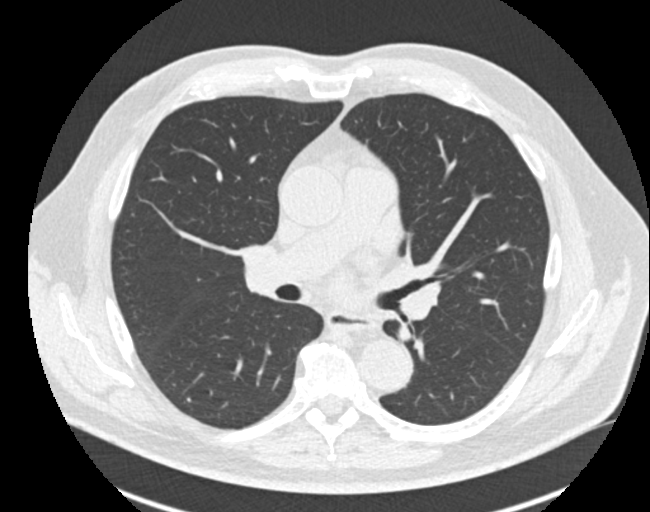

[Series 9: calcium scoring 2.00 br60 bestdiast 69% lungs · axial · 0.62mm/px · z∈[+1681,+1773]mm · 5 of 70 slices shown]
[im 12/70  vessel]
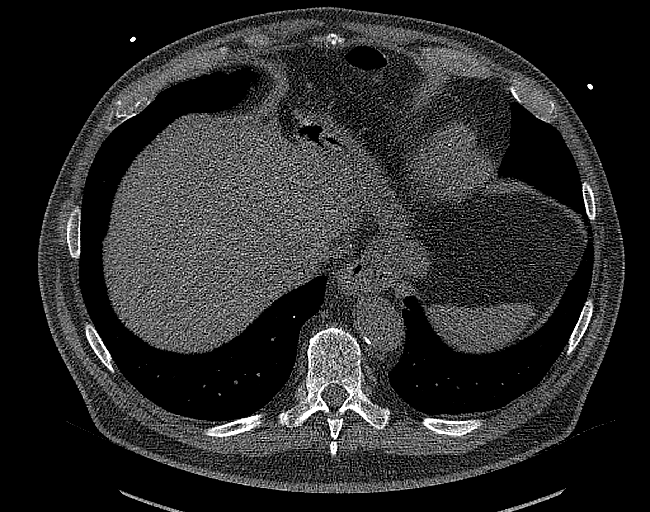
[im 24/70  vessel]
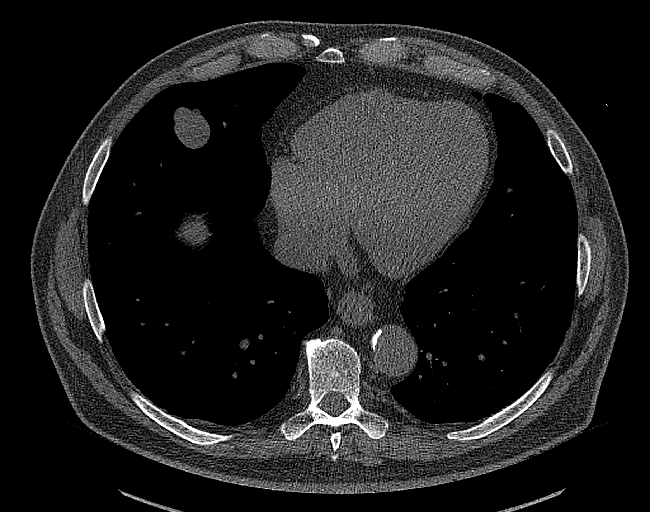
[im 35/70  vessel]
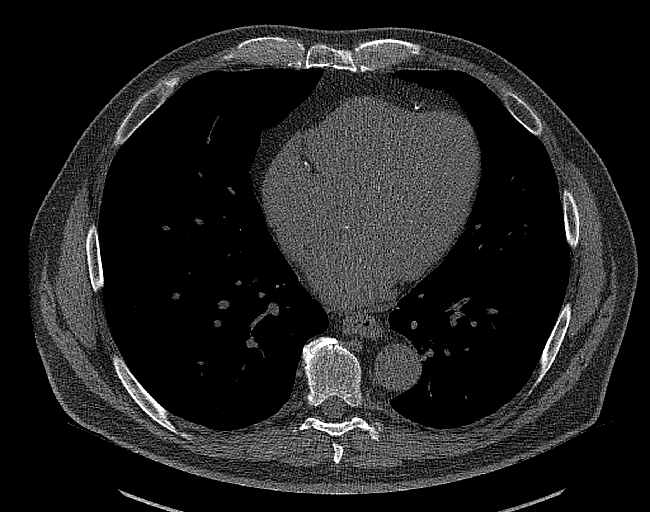
[im 47/70  vessel]
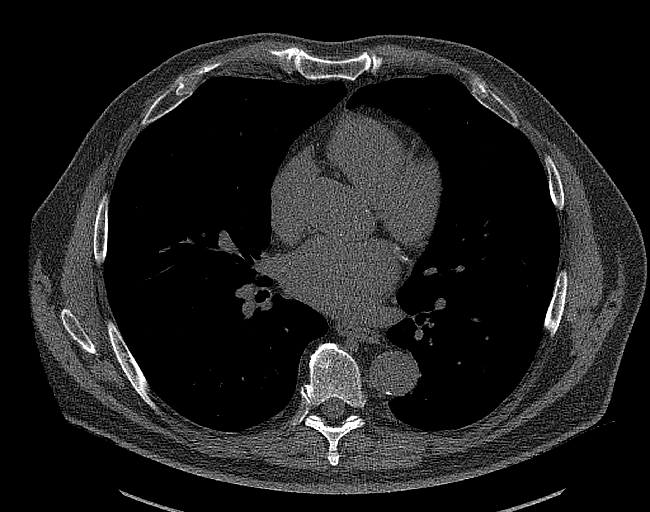
[im 58/70  vessel]
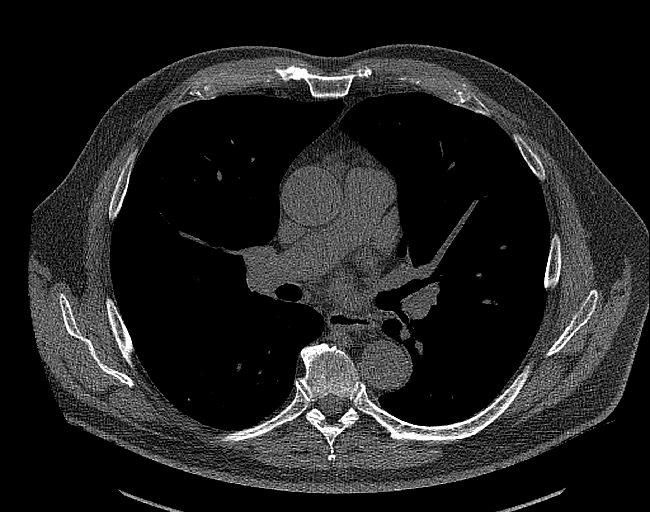

[14 of 20 positions shown; findings below may reference images not displayed]

FINDINGS: CORONARY CALCIUM SCORES:

Left Main: 0

LAD: 616

LCx: 118

RCA: 36

Total Agatston Score: 770

[HOSPITAL] percentile: 78th

AORTA MEASUREMENTS:

Ascending Aorta: 32 mm

Descending Aorta: 32 mm

OTHER FINDINGS:

Cardiovascular: Aortic atherosclerosis. Tortuous thoracic aorta.
Mild cardiomegaly. Aortic valve calcification.

Mediastinum/Nodes: No imaged thoracic adenopathy. Tiny hiatal
hernia.

Lungs/Pleura: No pleural fluid. Well-circumscribed right middle lobe
pulmonary nodule of 2.8 x 2.3 cm is mildly enlarged from 2.5 x
cm on 07/25/2016. 2.4 x 1.9 cm on 02/18/2014.

Likely calcified right lower lobe 3 mm nodule on [DATE], similar to
7319 and considered benign.

Upper Abdomen: Normal imaged portions of the liver, spleen.

Musculoskeletal: No acute osseous abnormality.
IMPRESSION: 1. Total Agatston score of 770, corresponding to 78th percentile for
age, sex, and race based cohort.
2. Tiny hiatal hernia.
3. Well-circumscribed right middle lobe pulmonary nodule of 2.8 x
2.3 cm is mildly enlarged from 2.5 x 2.2 cm on 07/25/2016. This is
favored to represent a benign lesion, most likely a hamartoma.
4. Aortic Atherosclerosis ([id number]-QVL.L).
5. Aortic valvular calcifications. Electronic medical record
describes a history of aortic stenosis.

## 2023-02-26 ENCOUNTER — Other Ambulatory Visit (HOSPITAL_COMMUNITY): Payer: Self-pay | Admitting: Internal Medicine

## 2023-02-26 DIAGNOSIS — R011 Cardiac murmur, unspecified: Secondary | ICD-10-CM

## 2023-03-05 ENCOUNTER — Encounter (HOSPITAL_COMMUNITY): Payer: Self-pay | Admitting: Internal Medicine

## 2023-03-30 ENCOUNTER — Other Ambulatory Visit (HOSPITAL_COMMUNITY)

## 2023-04-06 ENCOUNTER — Ambulatory Visit (HOSPITAL_COMMUNITY): Attending: Internal Medicine

## 2023-04-06 DIAGNOSIS — R011 Cardiac murmur, unspecified: Secondary | ICD-10-CM | POA: Diagnosis present

## 2023-04-06 LAB — ECHOCARDIOGRAM COMPLETE
AR max vel: 1.61 cm2
AV Area VTI: 1.67 cm2
AV Area mean vel: 1.51 cm2
AV Mean grad: 10 mmHg
AV Peak grad: 18.7 mmHg
Ao pk vel: 2.16 m/s
Area-P 1/2: 2.6 cm2
S' Lateral: 3.7 cm

## 2023-10-23 ENCOUNTER — Ambulatory Visit (INDEPENDENT_AMBULATORY_CARE_PROVIDER_SITE_OTHER): Admitting: Orthopedic Surgery

## 2023-10-23 DIAGNOSIS — L03115 Cellulitis of right lower limb: Secondary | ICD-10-CM

## 2023-10-25 NOTE — Progress Notes (Signed)
 Office Visit Note   Patient: Alexander Cooper           Date of Birth: [birth year]           MRN: [id number] Visit Date: 10/23/2023              Requested by: Charlott Dorn LABOR, MD [address] [place],  [place] [address] PCP: Signa Rush, MD (Inactive)  Chief Complaint  Patient presents with   Right Leg - Wound Check    cellulitis      HPI: Discussed the use of AI scribe software for clinical note transcription with the patient, who gave verbal consent to proceed.  History of Present Illness Alexander Cooper is a 78 year old male who presents with cellulitis of the foot following an ankle injury.  He developed cellulitis around the great toe and MTP joint after twisting his ankle and using an ACE bandage for support. The condition worsened after a week of using the bandage, which he adjusted daily.  He has been on antibiotics, specifically doxycycline  and Augmentin, for over a week. He completed the once-daily antibiotic this morning and will finish the twice-daily antibiotic this evening.  The cellulitis is painful. Initially, the entire end of his foot, except the toes, was affected.  No history of gout or diabetes. Diabetes does not run in his family.     Assessment & Plan: Visit Diagnoses: No diagnosis found.  Plan: Assessment and Plan Assessment & Plan Right great toe MTP joint cellulitis and ulcer Cellulitis and ulceration likely due to pressure from ACE wrap and shoe. Significant improvement with doxycycline  and Augmentin. No ascending cellulitis. Healthy granulation tissue. Strong dorsalis pedis pulse. - Provide Velcroed shoe to reduce pressure. - Instruct to wash foot daily, pat dry, apply Vosh dressing with dampened gauze. - Reapply ACE wrap and wear provided shoe. - Complete current antibiotic regimen. - Schedule follow-up in one week. - Instruct to call if increased pain or redness. - Order uric acid level at follow-up. - Order  hemoglobin A1c at follow-up.      Follow-Up Instructions: No follow-ups on file.   Ortho Exam  Patient is alert, oriented, no adenopathy, well-dressed, normal affect, normal respiratory effort. Physical Exam EXTREMITIES: Cellulitis around the great toe, MTP joint. Wound bed medial aspect of MTP joint with healthy granulation tissue. Strong palpable dorsalis pedis pulse. No ascending cellulitis. No tophaceous gouty deposits, no clinical signs of gout.      Imaging: No results found. No images are attached to the encounter.  Labs: Lab Results  Component Value Date   HGBA1C 5.2 04/01/2020   ESRSEDRATE 10 04/02/2020   CRP 5.5 (H) 04/02/2020   REPTSTATUS 04/06/2020 FINAL 04/01/2020   CULT  04/01/2020    NO GROWTH 5 DAYS Performed at [facility], [address], [place], [place] [address]      Lab Results  Component Value Date   ALBUMIN 2.9 (L) 04/02/2020   ALBUMIN 2.8 (L) 04/02/2020   PREALBUMIN 14.7 (L) 04/02/2020    Lab Results  Component Value Date   MG 2.2 04/03/2020   MG 2.2 04/02/2020   No results found for: [facility]  Lab Results  Component Value Date   PREALBUMIN 14.7 (L) 04/02/2020      Latest Ref Rng & Units 11/11/2020   10:35 AM 04/03/2020    3:49 AM 04/02/2020   12:45 AM  CBC EXTENDED  WBC 3.4 - 10.8 x10E3/uL 7.2  7.4  6.7   RBC 4.14 - 5.80 x10E6/uL 5.26  3.88  4.11   Hemoglobin 13.0 - 17.7 g/dL 84.6  88.0  87.4   HCT 37.5 - 51.0 % 45.6  37.0  36.8   Platelets 150 - 450 x10E3/uL 231  222  208   NEUT# 1.7 - 7.7 K/uL  4.7  4.5   Lymph# 0.7 - 4.0 K/uL  1.4  1.2      There is no height or weight on file to calculate BMI.  Orders:  No orders of the defined types were placed in this encounter.  No orders of the defined types were placed in this encounter.    Procedures: No procedures performed  Clinical Data: No additional findings.  ROS:  All other systems negative, except as noted in the HPI. Review of  Systems  Objective: Vital Signs: There were no vitals taken for this visit.  Specialty Comments:  No specialty comments available.  PMFS History: Patient Active Problem List   Diagnosis Date Noted   Hyperlipidemia 12/30/2020   Mild aortic stenosis 04/02/2020   Hypokalemia 04/02/2020   Cellulitis 04/01/2020   Essential hypertension 04/01/2020   OSA (obstructive sleep apnea) 04/01/2020   GERD (gastroesophageal reflux disease) 04/01/2020   Foot ulcer (HCC) 04/01/2020   Past Medical History:  Diagnosis Date   Aortic atherosclerosis    Atrial septal defect    Barrett's esophagus with low grade dysplasia    Body mass index (BMI) 31.0-31.9, adult    ED (erectile dysfunction)    GERD (gastroesophageal reflux disease)    Hypercholesterolemia    Hypertension    Obesity    Pulmonary nodule    Seasonal allergic rhinitis due to pollen    Sleep apnea    Cpap does not aways use    Family History  Problem Relation Age of Onset   CAD Father    Breast cancer Sister     Past Surgical History:  Procedure Laterality Date   COLONOSCOPY WITH PROPOFOL  N/A 08/17/2014   Procedure: COLONOSCOPY WITH PROPOFOL ;  Surgeon: Gladis MARLA Louder, MD;  Location: [facility];  Service: Endoscopy;  Laterality: N/A;   Social History   Occupational History   Not on file  Tobacco Use   Smoking status: Never   Smokeless tobacco: Never  Substance and Sexual Activity   Alcohol use: Yes    Comment: occasionally   Drug use: No   Sexual activity: Not on file

## 2023-10-30 ENCOUNTER — Ambulatory Visit: Admitting: Orthopedic Surgery

## 2023-11-05 ENCOUNTER — Encounter: Payer: Self-pay | Admitting: Radiology

## 2023-11-06 ENCOUNTER — Ambulatory Visit: Admitting: Orthopedic Surgery
